# Patient Record
Sex: Female | Born: 1990 | Race: White | Hispanic: No | Marital: Married | Smoking: Never smoker
Health system: Southern US, Community
[De-identification: ages and names within clinical notes are randomized; demographics above are authoritative.]

## PROBLEM LIST (undated history)

## (undated) DIAGNOSIS — F32A Depression, unspecified: Secondary | ICD-10-CM

## (undated) DIAGNOSIS — K5010 Crohn's disease of large intestine without complications: Secondary | ICD-10-CM

## (undated) DIAGNOSIS — J45909 Unspecified asthma, uncomplicated: Secondary | ICD-10-CM

## (undated) DIAGNOSIS — F419 Anxiety disorder, unspecified: Secondary | ICD-10-CM

## (undated) DIAGNOSIS — F319 Bipolar disorder, unspecified: Secondary | ICD-10-CM

## (undated) HISTORY — PX: WISDOM TOOTH EXTRACTION: SHX21

## (undated) HISTORY — DX: Anxiety disorder, unspecified: F41.9

## (undated) HISTORY — DX: Depression, unspecified: F32.A

## (undated) HISTORY — PX: DILATION AND CURETTAGE OF UTERUS: SHX78

## (undated) HISTORY — DX: Unspecified asthma, uncomplicated: J45.909

## (undated) HISTORY — DX: Bipolar disorder, unspecified: F31.9

---

## 2016-12-11 ENCOUNTER — Emergency Department (HOSPITAL_COMMUNITY)
Admission: EM | Admit: 2016-12-11 | Discharge: 2016-12-11 | Disposition: A | Discharge status: Home / Self Care | Payer: Self-pay | Attending: Emergency Medicine | Admitting: Emergency Medicine

## 2016-12-11 DIAGNOSIS — Z79899 Other long term (current) drug therapy: Secondary | ICD-10-CM | POA: Insufficient documentation

## 2016-12-11 DIAGNOSIS — R112 Nausea with vomiting, unspecified: Secondary | ICD-10-CM | POA: Insufficient documentation

## 2016-12-11 DIAGNOSIS — R197 Diarrhea, unspecified: Secondary | ICD-10-CM | POA: Insufficient documentation

## 2016-12-11 LAB — COMPREHENSIVE METABOLIC PANEL
ALT: 12 U/L — AB (ref 14–54)
AST: 16 U/L (ref 15–41)
Albumin: 3.6 g/dL (ref 3.5–5.0)
Alkaline Phosphatase: 57 U/L (ref 38–126)
Anion gap: 8 (ref 5–15)
BILIRUBIN TOTAL: 0.4 mg/dL (ref 0.3–1.2)
BUN: 13 mg/dL (ref 6–20)
CO2: 24 mmol/L (ref 22–32)
CREATININE: 0.82 mg/dL (ref 0.44–1.00)
Calcium: 8.8 mg/dL — ABNORMAL LOW (ref 8.9–10.3)
Chloride: 108 mmol/L (ref 101–111)
GFR calc Af Amer: >60 mL/min (ref >60)
Glucose, Bld: 129 mg/dL — ABNORMAL HIGH (ref 65–99)
POTASSIUM: 3.4 mmol/L — AB (ref 3.5–5.1)
Sodium: 140 mmol/L (ref 135–145)
TOTAL PROTEIN: 7.7 g/dL (ref 6.5–8.1)

## 2016-12-11 LAB — CBC
HCT: 43.4 % (ref 36.0–46.0)
Hemoglobin: 15.2 g/dL — ABNORMAL HIGH (ref 12.0–15.0)
MCH: 30.3 pg (ref 26.0–34.0)
MCHC: 35 g/dL (ref 30.0–36.0)
MCV: 86.5 fL (ref 78.0–100.0)
PLATELETS: 365 10*3/uL (ref 150–400)
RBC: 5.02 MIL/uL (ref 3.87–5.11)
RDW: 12.4 % (ref 11.5–15.5)
WBC: 15 10*3/uL — AB (ref 4.0–10.5)

## 2016-12-11 LAB — LIPASE, BLOOD: Lipase: 22 U/L (ref 11–51)

## 2016-12-11 LAB — I-STAT BETA HCG BLOOD, ED (MC, WL, AP ONLY): I-stat hCG, quantitative: <5 m[IU]/mL (ref <5)

## 2016-12-11 MED ORDER — ONDANSETRON HCL 4 MG PO TABS: 4.0000 mg | ORAL_TABLET | Freq: Three times a day (TID) | ORAL | 0 refills | Status: DC | PRN

## 2016-12-11 MED ORDER — DIPHENHYDRAMINE HCL 50 MG/ML IJ SOLN
25.0000 mg | Freq: Once | INTRAMUSCULAR | Status: AC
  Administered 2016-12-11: 25 mg via INTRAVENOUS
  Filled 2016-12-11: qty 1

## 2016-12-11 MED ORDER — DEXAMETHASONE SODIUM PHOSPHATE 10 MG/ML IJ SOLN
10.0000 mg | Freq: Once | INTRAMUSCULAR | Status: AC
  Administered 2016-12-11: 10 mg via INTRAVENOUS
  Filled 2016-12-11: qty 1

## 2016-12-11 MED ORDER — FAMOTIDINE IN NACL 20-0.9 MG/50ML-% IV SOLN
20.0000 mg | Freq: Once | INTRAVENOUS | Status: AC
  Administered 2016-12-11: 20 mg via INTRAVENOUS
  Filled 2016-12-11: qty 50

## 2016-12-11 MED ORDER — ONDANSETRON HCL 4 MG/2ML IJ SOLN: 4.0000 mg | Freq: Once | INTRAMUSCULAR | Status: DC

## 2016-12-11 MED ORDER — SODIUM CHLORIDE 0.9 % IV BOLUS (SEPSIS)
1000.0000 mL | Freq: Once | INTRAVENOUS | Status: AC
  Administered 2016-12-11: 1000 mL via INTRAVENOUS

## 2016-12-11 MED ORDER — FAMOTIDINE 20 MG PO TABS: 20.0000 mg | ORAL_TABLET | Freq: Two times a day (BID) | ORAL | 0 refills | Status: DC

## 2016-12-11 MED ORDER — PREDNISONE 50 MG PO TABS: ORAL_TABLET | ORAL | 0 refills | Status: DC

## 2016-12-11 NOTE — ED Notes (Signed)
Pt escorted to restroom and given water.

## 2016-12-11 NOTE — ED Notes (Signed)
Patient states she has been drinking a cup of water with no emesis-given additional gingerale

## 2016-12-11 NOTE — Discharge Instructions (Signed)
1 to 2 tablets of 25 mg Benadryl pills every 4-6 hours as needed to a maximum of 300 mg per day. In addition, you may apply a topical hydrocortisone ointment to all affected areas except for the face.  ° °Do not hesitate to call 911 or return to the emergency room if you develop any shortness of breath, wheezing, tongue or lip swelling. ° °Please follow with your primary care doctor in the next 2 days for a check-up. They must obtain records for further management.  ° °Do not hesitate to return to the Emergency Department for any new, worsening or concerning symptoms.  ° ° °

## 2016-12-11 NOTE — ED Triage Notes (Signed)
Pt states she had an acute onset of N/V/D today with diaphoresis. 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 500cc NS given en route. 20g RAC. Hypotensive on arrival at 88/50 but normotensive now. Alert and oriented.

## 2016-12-11 NOTE — ED Provider Notes (Signed)
[facility]-EMERGENCY DEPT Provider Note   CSN: [id number] Arrival date & time: 12/11/16  1704     History   Chief Complaint Chief Complaint  Patient presents with  . Emesis    HPI   Blood pressure 115/90, pulse 90, temperature (!) 97.5 F (36.4 C), temperature source Oral, resp. rate 16, SpO2 100 %.  Katrina Maddox is a 26 y.o. female complaining of 2 to onset of severe nonbloody, nonbilious, coffee-ground emesis with associated diarrhea when she was in class earlier in the afternoon with associated upper epigastric discomfort. She states that the symptoms are similar to prior episode of red meat allergy. She did not have any red meat data she knows of, she had a drink at [facility] and then she had [employer]. She was mildly hypotensive on scene with a systolic of 88, EMS gave Zofran and 500 mL bolus. She denies any shortness of breath, lip or tongue swelling, hives or pruritus. She's never had an anaphylactic reaction and does not carry an EpiPen. She did not take any medication before EMS arrived. She denies any fever or sick contacts. She denies any dysuria, hematuria, urinary frequency, abnormal vaginal discharge, flank pain.  No past medical history on file.  There are no active problems to display for this patient.   No past surgical history on file.  OB History    No data available       Home Medications    Prior to Admission medications   Medication Sig Start Date End Date Taking? Authorizing Provider  fexofenadine (ALLEGRA) 30 MG tablet Take 30 mg by mouth daily.   Yes [provider]  ibuprofen (ADVIL,MOTRIN) 800 MG tablet Take 800 mg by mouth every 8 (eight) hours as needed for mild pain or moderate pain.   Yes [provider]  TRINESSA, 28, 0.18/0.215/0.25 MG-35 MCG tablet Take 1 tablet by mouth daily. 11/20/16  Yes [provider]  famotidine (PEPCID) 20 MG tablet Take 1 tablet (20 mg total) by mouth 2 (two) times daily. 12/11/16   Katrina Maddox,  Katrina Reining, PA-C  ondansetron (ZOFRAN) 4 MG tablet Take 1 tablet (4 mg total) by mouth every 8 (eight) hours as needed for nausea or vomiting. 12/11/16   Jerita Wimbush, Katrina Reining, PA-C  predniSONE (DELTASONE) 50 MG tablet Take 1 tablet daily with breakfast 12/11/16   Katrina Maddox, Katrina Reining, PA-C    Family History No family history on file.  Social History Social History  Substance Use Topics  . Smoking status: Not on file  . Smokeless tobacco: Not on file  . Alcohol use Not on file     Allergies   Gelatin and Other   Review of Systems Review of Systems  A complete review of systems was obtained and all systems are negative except as noted in the HPI and PMH.    Physical Exam Updated Vital Signs BP (!) 101/57   Pulse 85   Temp (!) 97.5 F (36.4 C) (Oral)   Resp 18   SpO2 100%   Physical Exam  Constitutional: She is oriented to person, place, and time. She appears well-developed and well-nourished. No distress.  HENT:  Head: Normocephalic and atraumatic.  Mouth/Throat: Oropharynx is clear and moist.  Eyes: Pupils are equal, round, and reactive to light. Conjunctivae and EOM are normal.  Neck: Normal range of motion.  Cardiovascular: Normal rate, regular rhythm and intact distal pulses.   Pulmonary/Chest: Effort normal and breath sounds normal.  Abdominal: Soft. There is no tenderness.  Mild tenderness to palpation  the epigastrium with no guarding or rebound  Musculoskeletal: Normal range of motion.  Neurological: She is alert and oriented to person, place, and time.  Skin: She is not diaphoretic.  Psychiatric: She has a normal mood and affect.  Nursing note and vitals reviewed.    ED Treatments / Results  Labs (all labs ordered are listed, but only abnormal results are displayed) Labs Reviewed  COMPREHENSIVE METABOLIC PANEL - Abnormal; Notable for the following:       Result Value   Potassium 3.4 (*)    Glucose, Bld 129 (*)    Calcium 8.8 (*)    ALT 12 (*)    All other  components within normal limits  CBC - Abnormal; Notable for the following:    WBC 15.0 (*)    Hemoglobin 15.2 (*)    All other components within normal limits  LIPASE, BLOOD  I-STAT BETA HCG BLOOD, ED ([facility], [facility], [facility] ONLY)    EKG  EKG Interpretation None       Radiology No results found.  Procedures Procedures (including critical care time)  Medications Ordered in ED Medications  sodium chloride 0.9 % bolus 1,000 mL (1,000 mLs Intravenous New Bag/Given 12/11/16 1734)  diphenhydrAMINE (BENADRYL) injection 25 mg (25 mg Intravenous Given 12/11/16 1734)  famotidine (PEPCID) IVPB 20 mg premix (0 mg Intravenous Stopped 12/11/16 1828)  dexamethasone (DECADRON) injection 10 mg (10 mg Intravenous Given 12/11/16 1735)     Initial Impression / Assessment and Plan / ED Course  I have reviewed the triage vital signs and the nursing notes.  Pertinent labs & imaging results that were available during my care of the patient were reviewed by me and considered in my medical decision making (see chart for details).     Vitals:   12/11/16 1720 12/11/16 1830  BP: 115/90 (!) 101/57  Pulse: 90 85  Resp: 16 18  Temp: (!) 97.5 F (36.4 C)   TempSrc: Oral   SpO2: 100% 100%    Medications  sodium chloride 0.9 % bolus 1,000 mL (1,000 mLs Intravenous New Bag/Given 12/11/16 1734)  diphenhydrAMINE (BENADRYL) injection 25 mg (25 mg Intravenous Given 12/11/16 1734)  famotidine (PEPCID) IVPB 20 mg premix (0 mg Intravenous Stopped 12/11/16 1828)  dexamethasone (DECADRON) injection 10 mg (10 mg Intravenous Given 12/11/16 1735)    Katrina Maddox is 26 y.o. female presenting with Acute onset of nausea, vomiting, diarrhea with soft blood pressure via EMS. Abdominal exam with tenderness in the epigastrium, no guarding or rebound. She states it is similar to prior red meat allergy although she did not ingest any red meat that she's aware today. Physical exam reassuring, vital signs within normal limits. Patient will be  observed in the ED, given fluid bolus, basic blood work checked and allergy cocktail given.  Blood work reassuring, no urinary symptoms. UA DC'd. Repeat abdominal exam nonsurgical. We will by mouth challenge.  Patient tolerating by mouth's. Repeat abdominal exam is benign.  On discharge, I was informed by the nurse it this patient felt very lightheaded when standing. However when she ambulates she states that she feels steady on her feet. She likely is a little bit dehydrated. Of advised her to go slow when standing up and push fluids.  Evaluation does not show pathology that would require ongoing emergent intervention or inpatient treatment. Pt is hemodynamically stable and mentating appropriately. Discussed findings and plan with patient/guardian, who agrees with care plan. All questions answered. Return precautions discussed and outpatient follow up given.  Final Clinical Impressions(s) / ED Diagnoses   Final diagnoses:  Nausea vomiting and diarrhea    New Prescriptions New Prescriptions   FAMOTIDINE (PEPCID) 20 MG TABLET    Take 1 tablet (20 mg total) by mouth 2 (two) times daily.   ONDANSETRON (ZOFRAN) 4 MG TABLET    Take 1 tablet (4 mg total) by mouth every 8 (eight) hours as needed for nausea or vomiting.   PREDNISONE (DELTASONE) 50 MG TABLET    Take 1 tablet daily with breakfast     Deyja Sochacki, Mardella Layman 12/11/16 2007    Lydiana Milley, Mardella Layman 12/11/16 2030    Linwood Dibbles, MD 12/12/16 [phone]

## 2016-12-11 NOTE — ED Notes (Signed)
Patient ambulated well with stand by assist. Denies dizzy ness and nausea. Provider aware.

## 2016-12-11 NOTE — ED Notes (Signed)
Bed: ZO10 Expected date:  Expected time:  Means of arrival:  Comments: 26 yo abd pain, hypotension

## 2019-04-08 ENCOUNTER — Emergency Department (HOSPITAL_COMMUNITY)
Admission: EM | Admit: 2019-04-08 | Discharge: 2019-04-08 | Disposition: A | Discharge status: Home / Self Care | Payer: BC Managed Care – PPO | Attending: Emergency Medicine | Admitting: Emergency Medicine

## 2019-04-08 ENCOUNTER — Other Ambulatory Visit: Payer: Self-pay

## 2019-04-08 ENCOUNTER — Encounter (HOSPITAL_COMMUNITY): Payer: Self-pay | Admitting: *Deleted

## 2019-04-08 DIAGNOSIS — R197 Diarrhea, unspecified: Secondary | ICD-10-CM | POA: Diagnosis not present

## 2019-04-08 DIAGNOSIS — R112 Nausea with vomiting, unspecified: Secondary | ICD-10-CM | POA: Diagnosis not present

## 2019-04-08 DIAGNOSIS — R1084 Generalized abdominal pain: Secondary | ICD-10-CM | POA: Insufficient documentation

## 2019-04-08 DIAGNOSIS — Z79899 Other long term (current) drug therapy: Secondary | ICD-10-CM | POA: Insufficient documentation

## 2019-04-08 LAB — URINALYSIS, ROUTINE W REFLEX MICROSCOPIC
Bacteria, UA: NONE SEEN
Bilirubin Urine: NEGATIVE
Glucose, UA: NEGATIVE mg/dL
Ketones, ur: NEGATIVE mg/dL
Leukocytes,Ua: NEGATIVE
Nitrite: NEGATIVE
Protein, ur: 100 mg/dL — AB
Specific Gravity, Urine: 1.029 (ref 1.005–1.030)
pH: 5 (ref 5.0–8.0)

## 2019-04-08 LAB — COMPREHENSIVE METABOLIC PANEL
ALT: 22 U/L (ref 0–44)
AST: 18 U/L (ref 15–41)
Albumin: 4.3 g/dL (ref 3.5–5.0)
Alkaline Phosphatase: 53 U/L (ref 38–126)
Anion gap: 8 (ref 5–15)
BUN: 17 mg/dL (ref 6–20)
CO2: 24 mmol/L (ref 22–32)
Calcium: 8.5 mg/dL — ABNORMAL LOW (ref 8.9–10.3)
Chloride: 105 mmol/L (ref 98–111)
Creatinine, Ser: 0.66 mg/dL (ref 0.44–1.00)
GFR calc Af Amer: >60 mL/min (ref >60)
GFR calc non Af Amer: >60 mL/min (ref >60)
Glucose, Bld: 98 mg/dL (ref 70–99)
Potassium: 3.3 mmol/L — ABNORMAL LOW (ref 3.5–5.1)
Sodium: 137 mmol/L (ref 135–145)
Total Bilirubin: 0.5 mg/dL (ref 0.3–1.2)
Total Protein: 7.9 g/dL (ref 6.5–8.1)

## 2019-04-08 LAB — CBC WITH DIFFERENTIAL/PLATELET
Abs Immature Granulocytes: 0.06 10*3/uL (ref 0.00–0.07)
Basophils Absolute: 0 10*3/uL (ref 0.0–0.1)
Basophils Relative: 0 %
Eosinophils Absolute: 0.1 10*3/uL (ref 0.0–0.5)
Eosinophils Relative: 1 %
HCT: 41.5 % (ref 36.0–46.0)
Hemoglobin: 14.4 g/dL (ref 12.0–15.0)
Immature Granulocytes: 0 %
Lymphocytes Relative: 8 %
Lymphs Abs: 1.2 10*3/uL (ref 0.7–4.0)
MCH: 32.1 pg (ref 26.0–34.0)
MCHC: 34.7 g/dL (ref 30.0–36.0)
MCV: 92.4 fL (ref 80.0–100.0)
Monocytes Absolute: 0.9 10*3/uL (ref 0.1–1.0)
Monocytes Relative: 6 %
Neutro Abs: 13.4 10*3/uL — ABNORMAL HIGH (ref 1.7–7.7)
Neutrophils Relative %: 85 %
Platelets: 260 10*3/uL (ref 150–400)
RBC: 4.49 MIL/uL (ref 3.87–5.11)
RDW: 11.7 % (ref 11.5–15.5)
WBC: 15.6 10*3/uL — ABNORMAL HIGH (ref 4.0–10.5)
nRBC: 0 % (ref 0.0–0.2)

## 2019-04-08 LAB — PREGNANCY, URINE: Preg Test, Ur: NEGATIVE

## 2019-04-08 LAB — LIPASE, BLOOD: Lipase: 24 U/L (ref 11–51)

## 2019-04-08 LAB — WET PREP, GENITAL
Clue Cells Wet Prep HPF POC: NONE SEEN
Trich, Wet Prep: NONE SEEN
Yeast Wet Prep HPF POC: NONE SEEN

## 2019-04-08 MED ORDER — METOCLOPRAMIDE HCL 5 MG/ML IJ SOLN
10.0000 mg | Freq: Once | INTRAMUSCULAR | Status: AC
  Administered 2019-04-08: 10 mg via INTRAVENOUS
  Filled 2019-04-08: qty 2

## 2019-04-08 MED ORDER — DICYCLOMINE HCL 10 MG PO CAPS
20.0000 mg | ORAL_CAPSULE | Freq: Once | ORAL | Status: AC
  Administered 2019-04-08: 20 mg via ORAL
  Filled 2019-04-08: qty 2

## 2019-04-08 MED ORDER — POTASSIUM CHLORIDE CRYS ER 20 MEQ PO TBCR: 20.0000 meq | EXTENDED_RELEASE_TABLET | Freq: Every day | ORAL | 0 refills | Status: DC

## 2019-04-08 MED ORDER — SODIUM CHLORIDE 0.9 % IV BOLUS
1000.0000 mL | Freq: Once | INTRAVENOUS | Status: AC
  Administered 2019-04-08: 1000 mL via INTRAVENOUS

## 2019-04-08 MED ORDER — METOCLOPRAMIDE HCL 10 MG PO TABS: 10.0000 mg | ORAL_TABLET | Freq: Three times a day (TID) | ORAL | 0 refills | Status: DC | PRN

## 2019-04-08 MED ORDER — DICYCLOMINE HCL 20 MG PO TABS: 20.0000 mg | ORAL_TABLET | Freq: Three times a day (TID) | ORAL | 0 refills | Status: DC | PRN

## 2019-04-08 MED ORDER — POTASSIUM CHLORIDE CRYS ER 20 MEQ PO TBCR
40.0000 meq | EXTENDED_RELEASE_TABLET | Freq: Once | ORAL | Status: AC
  Administered 2019-04-08: 20:00:00 40 meq via ORAL
  Filled 2019-04-08: qty 2

## 2019-04-08 NOTE — ED Provider Notes (Signed)
[facility] EMERGENCY DEPARTMENT Provider Note   CSN: [id number] Arrival date & time: 04/08/19  1635     History Chief Complaint  Patient presents with  . Abdominal Pain    Katrina Maddox is a 29 y.o. female without significant past medical hx who presents to the ED with complaints of abdominal pain that began @ 0300 today. Patient states that she developed pain to the periumbilical area which then seemed to spread to the rest of her generalized abdomen with subsequent 10-15 episodes of emesis and 2 episodes of diarrhea. Tried zofran without relief. Called 911- given zofran & toradol IV with improvement in her sxs. No other alleviating/aggravating factors. She is currently experiencing pain to the epigastric area which she feels is sore from vomiting as well as spasms/sharp pain to the lower abdomen. Denies fever, chills, hematemesis, melena, hematochezia, dysuria, or vaginal discharge. Mild vaginal bleeding started yesterday consistent w/ menses. She is sexually active in a monogamous relationship without concern for STDs. Denies recent travel. Denies marijuana use.   She has had 4 prior similar episodes over the past 2 months, typically sxs resolve within 10 hours of onset,  most recent episode was 04/01/19 which prompted ED visit to [facility]- she had RUQ [place] that was negative, CT A/P: infectious enteritis vs. Inflammatory bowel disease, GI consulted, discharged home on Augmentin w/ outpatient follow up. Followed up with GI 04/05/19, plan for outpatient labs & for colonoscopy 04/22/18.    HPI     History reviewed. No pertinent past medical history.  There are no problems to display for this patient.   Past Surgical History:  Procedure Laterality Date  . WISDOM TOOTH EXTRACTION       OB History   No obstetric history on file.     No family history on file.  Social History   Tobacco Use  . Smoking status: Never Smoker  . Smokeless tobacco: Never Used  Substance Use Topics  .  Alcohol use: Yes  . Drug use: Never    Home Medications Prior to Admission medications   Medication Sig Start Date End Date Taking? Authorizing Provider  famotidine (PEPCID) 20 MG tablet Take 1 tablet (20 mg total) by mouth 2 (two) times daily. 12/11/16   Pisciotta, Joni Reining, PA-C  fexofenadine (ALLEGRA) 30 MG tablet Take 30 mg by mouth daily.    [provider]  ibuprofen (ADVIL,MOTRIN) 800 MG tablet Take 800 mg by mouth every 8 (eight) hours as needed for mild pain or moderate pain.    [provider]  ondansetron (ZOFRAN) 4 MG tablet Take 1 tablet (4 mg total) by mouth every 8 (eight) hours as needed for nausea or vomiting. 12/11/16   Pisciotta, Joni Reining, PA-C  predniSONE (DELTASONE) 50 MG tablet Take 1 tablet daily with breakfast 12/11/16   Pisciotta, Nicole, PA-C  TRINESSA, 28, 0.18/0.215/0.25 MG-35 MCG tablet Take 1 tablet by mouth daily. 11/20/16   [provider]    Allergies    Gelatin and Other  Review of Systems   Review of Systems  Constitutional: Negative for chills and fever.  Respiratory: Negative for shortness of breath.   Cardiovascular: Negative for chest pain.  Gastrointestinal: Positive for abdominal pain, diarrhea, nausea and vomiting. Negative for anal bleeding, blood in stool and constipation.  Genitourinary: Positive for vaginal bleeding. Negative for dysuria and vaginal discharge.  Neurological: Negative for syncope.  All other systems reviewed and are negative.   Physical Exam Updated Vital Signs Ht 5\' 5"  (1.651 m)  Wt 100.7 kg   LMP 04/06/2019   BMI 36.94 kg/m   Physical Exam Vitals and nursing note reviewed. Exam conducted with a chaperone present.  Constitutional:      General: She is not in acute distress.    Appearance: She is well-developed. She is not toxic-appearing.  HENT:     Head: Normocephalic and atraumatic.  Eyes:     General:        Right eye: No discharge.        Left eye: No discharge.     Conjunctiva/sclera:  Conjunctivae normal.  Cardiovascular:     Rate and Rhythm: Normal rate and regular rhythm.  Pulmonary:     Effort: Pulmonary effort is normal. No respiratory distress.     Breath sounds: Normal breath sounds. No wheezing, rhonchi or rales.  Abdominal:     General: There is no distension.     Palpations: Abdomen is soft.     Tenderness: There is generalized abdominal tenderness (epigastric, periumbilical, and lower abdomen most prominent). There is no guarding or rebound.  Genitourinary:    Labia:        Right: No lesion.        Left: No lesion.      Comments: No discharge. Mild blood present. IUD strings present coming from cervix. Mild discomfort throughout without focal tenderness or cervical motion tenderness.  Musculoskeletal:     Cervical back: Neck supple.  Skin:    General: Skin is warm and dry.     Findings: No rash.  Neurological:     Mental Status: She is alert.     Comments: Clear speech.   Psychiatric:        Behavior: Behavior normal.     ED Results / Procedures / Treatments   Labs (all labs ordered are listed, but only abnormal results are displayed) Labs Reviewed - No data to display  EKG None  Radiology No results found.  Procedures Procedures (including critical care time)  Medications Ordered in ED Medications - No data to display  ED Course  I have reviewed the triage vital signs and the nursing notes.  Pertinent labs & imaging results that were available during my care of the patient were reviewed by me and considered in my medical decision making (see chart for details).     Prior results reviewed:  CT A/P:  IMPRESSION:  Air-fluid levels at nondistended bowel, which may represent enteritis. Additionally, enhancement/hyperemia at mucosal of terminal ileum. Differential diagnosis includes infectious enteritis and inflammatory bowel disease.  No evidence of appendicitis or diverticulitis.  Physiologic amount of free fluid in the pelvis.   RUQ [place]:  Negative  Bonne Driver was evaluated in Emergency Department on 04/08/2019 for the symptoms described in the history of present illness. He/she was evaluated in the context of the global COVID-19 pandemic, which necessitated consideration that the patient might be at risk for infection with the SARS-CoV-2 virus that causes COVID-19. Institutional protocols and algorithms that pertain to the evaluation of patients at risk for COVID-19 are in a state of rapid change based on information released by regulatory bodies including the CDC and federal and state organizations. These policies and algorithms were followed during the patient's care in the ED.  MDM Rules/Calculators/A&P                      Patient presents to the ED with complaints of abdominal pain with N/V/D 5th episode of similar- most recent was  04/01/19 which prompted ED visit to [facility]- she had RUQ [place] that was negative, CT A/P: infectious enteritis vs. Inflammatory bowel disease, GI consulted, discharged home on Augmentin w/ outpatient follow up. Followed up with GI 04/05/19, plan for outpatient labs & for colonoscopy 04/22/18. She is on her last day of Augmentin today. Abdomen with generalized tenderness, more so in the epigastric, periumbilical, and lower abdominal regions. No peritoneal signs. Pelvic performed, no significant vaginal discharge, bleeding consistent w/ her menses, IUD strings appear in appropriate place, H&P do not seem consistent with PID. Plan for labs with trial of fluids, bentyl & reglan.   CBC: Leukocytosis @ 15.6, similar to prior, downtrending from 20 @ last ED visit. No anemia.  CMP: Hypokalemia @ 3.3- oral replacement, diet guidelines. Hypocalcemia @ 8.5- diet guidelines. Renal function & LFTs WNL.  Lipase: WNL UA: Consistent with menses Preg test negative Wet prep: No BV, Trich, or yeast.   Patient feeling improved on re-assessment, tolerating PO, repeat abdominal exam remains w/o peritoneal signs,  recent imaging reviewed and with similar presentation do not feel this need repeating, doubt acute surgical etiology currently such as appendicitis, cholecystitis, perf, or obstruction. Negative pregnancy- doubt ectopic.  Symptomatic care at home. She is established with GI and we have discussed close follow up.  I discussed results, treatment plan, need for follow-up, and return precautions with the patient. Provided opportunity for questions, patient confirmed understanding and is in agreement with plan.   Findings and plan of care discussed with supervising physician Dr. Sedonia Small who is in agreement.   Final Clinical Impression(s) / ED Diagnoses Final diagnoses:  Generalized abdominal pain    Rx / DC Orders ED Discharge Orders         Ordered    dicyclomine (BENTYL) 20 MG tablet  Every 8 hours PRN     04/08/19 1930    metoCLOPramide (REGLAN) 10 MG tablet  Every 8 hours PRN     04/08/19 1930    potassium chloride SA (KLOR-CON) 20 MEQ tablet  Daily     04/08/19 1930           Amaryllis Dyke, PA-C 04/08/19 1946    Maudie Flakes, MD 04/14/19 2056

## 2019-04-08 NOTE — ED Triage Notes (Signed)
Pt gc/o abd pain with n/v that started a month ago but has been intermittent, was seen at [facility] in  for same. Pt was given zofran 4 mg iv and toradol 30 mg iv by ems

## 2019-04-08 NOTE — Discharge Instructions (Addendum)
You were seen in the emergency department today for nausea, vomiting, abdominal pain, diarrhea.  Your labs overall looked reassuring.  Your potassium and calcium are a bit low, please follow attached diet guidelines, we are also sending you home with a few days of a potassium supplement.  We are sending you with Bentyl to take every 8 hours as needed for abdominal cramping/spasms as well as Reglan to take every 8 hours as needed for nausea and vomiting.  We have prescribed you new medication(s) today. Discuss the medications prescribed today with your pharmacist as they can have adverse effects and interactions with your other medicines including over the counter and prescribed medications. Seek medical evaluation if you start to experience new or abnormal symptoms after taking one of these medicines, seek care immediately if you start to experience difficulty breathing, feeling of your throat closing, facial swelling, or rash as these could be indications of a more serious allergic reaction  Please call your GI doctor on Monday to make them aware of your ER visit and to establish close follow-up.  Return to the ER for new or worsening symptoms including but not limited to worsening pain, inability to keep fluids down, fever, blood in your vomit or stool, or any other concerns.

## 2019-04-11 LAB — GC/CHLAMYDIA PROBE AMP (~~LOC~~) NOT AT ARMC
Chlamydia: NEGATIVE
Neisseria Gonorrhea: NEGATIVE

## 2020-02-18 ENCOUNTER — Encounter (HOSPITAL_COMMUNITY): Payer: Self-pay | Admitting: Emergency Medicine

## 2020-02-18 ENCOUNTER — Emergency Department (HOSPITAL_COMMUNITY)
Admission: EM | Admit: 2020-02-18 | Discharge: 2020-02-18 | Disposition: A | Discharge status: Home / Self Care | Payer: BC Managed Care – PPO | Attending: Emergency Medicine | Admitting: Emergency Medicine

## 2020-02-18 ENCOUNTER — Other Ambulatory Visit: Payer: Self-pay

## 2020-02-18 DIAGNOSIS — R197 Diarrhea, unspecified: Secondary | ICD-10-CM | POA: Diagnosis not present

## 2020-02-18 DIAGNOSIS — R1084 Generalized abdominal pain: Secondary | ICD-10-CM | POA: Insufficient documentation

## 2020-02-18 DIAGNOSIS — R112 Nausea with vomiting, unspecified: Secondary | ICD-10-CM | POA: Diagnosis not present

## 2020-02-18 DIAGNOSIS — R109 Unspecified abdominal pain: Secondary | ICD-10-CM | POA: Diagnosis present

## 2020-02-18 HISTORY — DX: Crohn's disease of large intestine without complications: K50.10

## 2020-02-18 LAB — CBC WITH DIFFERENTIAL/PLATELET
Abs Immature Granulocytes: 0.06 10*3/uL (ref 0.00–0.07)
Basophils Absolute: 0 10*3/uL (ref 0.0–0.1)
Basophils Relative: 0 %
Eosinophils Absolute: 0 10*3/uL (ref 0.0–0.5)
Eosinophils Relative: 0 %
HCT: 41.4 % (ref 36.0–46.0)
Hemoglobin: 14 g/dL (ref 12.0–15.0)
Immature Granulocytes: 1 %
Lymphocytes Relative: 17 %
Lymphs Abs: 1.6 10*3/uL (ref 0.7–4.0)
MCH: 30.5 pg (ref 26.0–34.0)
MCHC: 33.8 g/dL (ref 30.0–36.0)
MCV: 90.2 fL (ref 80.0–100.0)
Monocytes Absolute: 0.3 10*3/uL (ref 0.1–1.0)
Monocytes Relative: 3 %
Neutro Abs: 7.5 10*3/uL (ref 1.7–7.7)
Neutrophils Relative %: 79 %
Platelets: 301 10*3/uL (ref 150–400)
RBC: 4.59 MIL/uL (ref 3.87–5.11)
RDW: 11.8 % (ref 11.5–15.5)
WBC: 9.5 10*3/uL (ref 4.0–10.5)
nRBC: 0 % (ref 0.0–0.2)

## 2020-02-18 LAB — COMPREHENSIVE METABOLIC PANEL
ALT: 17 U/L (ref 0–44)
AST: 19 U/L (ref 15–41)
Albumin: 3.7 g/dL (ref 3.5–5.0)
Alkaline Phosphatase: 52 U/L (ref 38–126)
Anion gap: 12 (ref 5–15)
BUN: 10 mg/dL (ref 6–20)
CO2: 22 mmol/L (ref 22–32)
Calcium: 8.5 mg/dL — ABNORMAL LOW (ref 8.9–10.3)
Chloride: 104 mmol/L (ref 98–111)
Creatinine, Ser: 0.61 mg/dL (ref 0.44–1.00)
GFR, Estimated: >60 mL/min (ref >60)
Glucose, Bld: 95 mg/dL (ref 70–99)
Potassium: 3.6 mmol/L (ref 3.5–5.1)
Sodium: 138 mmol/L (ref 135–145)
Total Bilirubin: 0.4 mg/dL (ref 0.3–1.2)
Total Protein: 7.4 g/dL (ref 6.5–8.1)

## 2020-02-18 LAB — LIPASE, BLOOD: Lipase: 27 U/L (ref 11–51)

## 2020-02-18 LAB — URINALYSIS, ROUTINE W REFLEX MICROSCOPIC
Bilirubin Urine: NEGATIVE
Glucose, UA: NEGATIVE mg/dL
Hgb urine dipstick: NEGATIVE
Ketones, ur: NEGATIVE mg/dL
Leukocytes,Ua: NEGATIVE
Nitrite: NEGATIVE
Protein, ur: NEGATIVE mg/dL
Specific Gravity, Urine: 1.021 (ref 1.005–1.030)
pH: 6 (ref 5.0–8.0)

## 2020-02-18 LAB — I-STAT BETA HCG BLOOD, ED (MC, WL, AP ONLY): I-stat hCG, quantitative: <5 m[IU]/mL (ref <5)

## 2020-02-18 MED ORDER — DICYCLOMINE HCL 10 MG PO CAPS
10.0000 mg | ORAL_CAPSULE | Freq: Once | ORAL | Status: AC
  Administered 2020-02-18: 10 mg via ORAL
  Filled 2020-02-18: qty 1

## 2020-02-18 MED ORDER — DICYCLOMINE HCL 20 MG PO TABS: 20.0000 mg | ORAL_TABLET | Freq: Three times a day (TID) | ORAL | 0 refills | Status: DC | PRN

## 2020-02-18 MED ORDER — SODIUM CHLORIDE 0.9 % IV BOLUS
1000.0000 mL | Freq: Once | INTRAVENOUS | Status: AC
  Administered 2020-02-18: 1000 mL via INTRAVENOUS

## 2020-02-18 MED ORDER — ONDANSETRON 4 MG PO TBDP: 4.0000 mg | ORAL_TABLET | Freq: Three times a day (TID) | ORAL | 0 refills | Status: DC | PRN

## 2020-02-18 MED ORDER — KETOROLAC TROMETHAMINE 15 MG/ML IJ SOLN
15.0000 mg | Freq: Once | INTRAMUSCULAR | Status: AC
  Administered 2020-02-18: 15 mg via INTRAVENOUS
  Filled 2020-02-18: qty 1

## 2020-02-18 NOTE — ED Notes (Signed)
Pt tolerated PO intake of graham crackers and soda w/o NV

## 2020-02-18 NOTE — ED Notes (Signed)
Pt tolerated PO challenge of soda and graham crackers w/o N

## 2020-02-18 NOTE — Discharge Instructions (Addendum)
Continue taking home medication as prescribed. Use Tylenol or ibuprofen as needed for pain.  You may supplement with Bentyl as needed for further abdominal pain or cramping. Zofran as needed for nausea or vomiting. Eat a gentle, bland diet until your symptoms improve. Follow-up with your GI doctor for further evaluation of your symptoms. Return to the emergency room if you develop fevers, persistent vomiting, severe worsening abdominal pain, persistent bleeding from your bottom, or any new, worsening, or concerning symptoms.

## 2020-02-18 NOTE — ED Triage Notes (Signed)
Pt. Stated, Katrina Maddox had a episode of Crohn's disease. This started 3-4 days ago. Ive had diarrhea, throwing. I ususally come here because yall give me fluids and medicine instead of just medicine.

## 2020-02-18 NOTE — ED Provider Notes (Signed)
[facility] EMERGENCY DEPARTMENT Provider Note   CSN: [id number] Arrival date & time: 02/18/20  0830     History Chief Complaint  Patient presents with  . Abdominal Pain  . Emesis  . Diarrhea    Katrina Maddox is a 29 y.o. female presenting for evaluation of abdominal pain, nausea, vomiting, abnormal bowel movements.  Patient states she has a history of Crohn's.  For the past few days, she has felt like she has had a Crohn's flare.  She reports persistent nausea, dry heaves last night.  She reports lower abdominal pain with sharp epigastric pain, likely secondary to vomiting.  She was able to take her ODT Zofran this morning, which improved her nausea and vomiting.  She has not taken anything for pain.  She denies fevers, chills, chest pain, shortness breath, cough.  She does have decreased urination, which she attributes to decreased p.o. intake and diarrhea.  She has intermittent blood in her stools, this is typical for her.  She is on mesalamine for Crohn's, no recent change in dose.  She is not on any prednisone.  She follows with [facility].  No previous history of abdominal surgeries.  No sick contacts.  HPI     Past Medical History:  Diagnosis Date  . Crohn's colitis (HCC)     There are no problems to display for this patient.   Past Surgical History:  Procedure Laterality Date  . WISDOM TOOTH EXTRACTION       OB History   No obstetric history on file.     No family history on file.  Social History   Tobacco Use  . Smoking status: Never Smoker  . Smokeless tobacco: Never Used  Substance Use Topics  . Alcohol use: Yes  . Drug use: Never    Home Medications Prior to Admission medications   Medication Sig Start Date End Date Taking? Authorizing Provider  dicyclomine (BENTYL) 20 MG tablet Take 1 tablet (20 mg total) by mouth every 8 (eight) hours as needed for spasms. 02/18/20   Rayven Hendrickson, PA-C  famotidine (PEPCID) 20 MG tablet Take  1 tablet (20 mg total) by mouth 2 (two) times daily. 12/11/16   Pisciotta, Joni Reining, PA-C  fexofenadine (ALLEGRA) 30 MG tablet Take 30 mg by mouth daily.    [provider]  ibuprofen (ADVIL,MOTRIN) 800 MG tablet Take 800 mg by mouth every 8 (eight) hours as needed for mild pain or moderate pain.    [provider]  metoCLOPramide (REGLAN) 10 MG tablet Take 1 tablet (10 mg total) by mouth every 8 (eight) hours as needed for nausea. 04/08/19   Petrucelli, Samantha R, PA-C  ondansetron (ZOFRAN ODT) 4 MG disintegrating tablet Take 1 tablet (4 mg total) by mouth every 8 (eight) hours as needed for nausea or vomiting. 02/18/20   Asif Muchow, PA-C  ondansetron (ZOFRAN) 4 MG tablet Take 1 tablet (4 mg total) by mouth every 8 (eight) hours as needed for nausea or vomiting. 12/11/16   Pisciotta, Joni Reining, PA-C  potassium chloride SA (KLOR-CON) 20 MEQ tablet Take 1 tablet (20 mEq total) by mouth daily. 04/08/19   Petrucelli, Pleas Koch, PA-C  predniSONE (DELTASONE) 50 MG tablet Take 1 tablet daily with breakfast 12/11/16   Pisciotta, Nicole, PA-C  TRINESSA, 28, 0.18/0.215/0.25 MG-35 MCG tablet Take 1 tablet by mouth daily. 11/20/16   [provider]    Allergies    Gelatin and Other  Review of Systems   Review of Systems  Gastrointestinal: Positive for abdominal pain, diarrhea, nausea and vomiting.  Allergic/Immunologic: Positive for immunocompromised state (on mesalamine).  All other systems reviewed and are negative.   Physical Exam Updated Vital Signs BP 127/89   Pulse 83   Temp 98 F (36.7 C)   Resp 18   LMP 02/05/2020   SpO2 100%   Physical Exam Vitals and nursing note reviewed.  Constitutional:      General: She is not in acute distress.    Appearance: She is well-developed.     Comments: Appears nontoxic  HENT:     Head: Normocephalic and atraumatic.  Eyes:     Extraocular Movements: Extraocular movements intact.     Conjunctiva/sclera: Conjunctivae normal.      Pupils: Pupils are equal, round, and reactive to light.  Cardiovascular:     Rate and Rhythm: Normal rate and regular rhythm.     Pulses: Normal pulses.  Pulmonary:     Effort: Pulmonary effort is normal. No respiratory distress.     Breath sounds: Normal breath sounds. No wheezing.  Abdominal:     General: There is no distension.     Palpations: Abdomen is soft. There is no mass.     Tenderness: There is no abdominal tenderness. There is no guarding or rebound.     Comments: Mild diffuse tenderness palpation of the abdomen.  No rigidity, guarding, distention.  No rebound.  No peritonitis.  Musculoskeletal:        General: Normal range of motion.     Cervical back: Normal range of motion and neck supple.  Skin:    General: Skin is warm and dry.     Capillary Refill: Capillary refill takes less than 2 seconds.  Neurological:     Mental Status: She is alert and oriented to person, place, and time.     ED Results / Procedures / Treatments   Labs (all labs ordered are listed, but only abnormal results are displayed) Labs Reviewed  COMPREHENSIVE METABOLIC PANEL - Abnormal; Notable for the following components:      Result Value   Calcium 8.5 (*)    All other components within normal limits  LIPASE, BLOOD  URINALYSIS, ROUTINE W REFLEX MICROSCOPIC  CBC WITH DIFFERENTIAL/PLATELET  I-STAT BETA HCG BLOOD, ED ([facility], [facility], [facility] ONLY)    EKG None  Radiology No results found.  Procedures Procedures (including critical care time)  Medications Ordered in ED Medications  ketorolac (TORADOL) 15 MG/ML injection 15 mg (15 mg Intravenous Given 02/18/20 0958)  sodium chloride 0.9 % bolus 1,000 mL (0 mLs Intravenous Stopped 02/18/20 1056)  dicyclomine (BENTYL) capsule 10 mg (10 mg Oral Given 02/18/20 1056)    ED Course  I have reviewed the triage vital signs and the nursing notes.  Pertinent labs & imaging results that were available during my care of the patient were reviewed by me and  considered in my medical decision making (see chart for details).    MDM Rules/Calculators/A&P                          Patient presented for evaluation nausea, vomiting, abdominal pain.  On exam, patient appears nontoxic.  She does have diffuse tenderness palpation of the abdomen.  She states this feels similar to her previous Crohn's flares, likely this.  However also consider pancreatitis, gallbladder etiology, viral GI illness.  Will obtain labs, treat symptomatically, and reassess.  Labs interpreted by me, overall reassuring granulocytosis.  Electrolytes  stable.  Kidney, liver, pancreatic function normal.  On reassessment, patient reports improvement of pain, however she continues to have discomfort in her lower abdomen.  She is hungry, but scared to try and eat.  Will p.o. challenge here in the ER, give Bentyl for further pain control and reassess.  On reassessment, patient tolerated p.o. without difficulty.  She is feeling better.  I discussed continued symptomatic treatment at home, and close follow-up with GI.  At this time, patient appears safe for discharge.  Return precautions given.  Patient states she understands and agrees to plan.  Final Clinical Impression(s) / ED Diagnoses Final diagnoses:  Nausea vomiting and diarrhea  Generalized abdominal pain    Rx / DC Orders ED Discharge Orders         Ordered    dicyclomine (BENTYL) 20 MG tablet  Every 8 hours PRN        02/18/20 1142    ondansetron (ZOFRAN ODT) 4 MG disintegrating tablet  Every 8 hours PRN        02/18/20 1142           Siddharth Babington, PA-C 02/18/20 1143    Lorre Nick, MD 02/24/20 1352

## 2021-06-20 LAB — OB RESULTS CONSOLE HIV ANTIBODY (ROUTINE TESTING): HIV: NONREACTIVE

## 2021-06-20 LAB — HEPATITIS C ANTIBODY: HCV Ab: NEGATIVE

## 2021-06-20 LAB — OB RESULTS CONSOLE RUBELLA ANTIBODY, IGM: Rubella: IMMUNE

## 2021-06-20 LAB — OB RESULTS CONSOLE RPR: RPR: NONREACTIVE

## 2021-06-20 LAB — OB RESULTS CONSOLE ABO/RH: RH Type: POSITIVE

## 2021-06-20 LAB — OB RESULTS CONSOLE ANTIBODY SCREEN: Antibody Screen: NEGATIVE

## 2021-06-20 LAB — OB RESULTS CONSOLE HEPATITIS B SURFACE ANTIGEN: Hepatitis B Surface Ag: NEGATIVE

## 2021-07-05 LAB — OB RESULTS CONSOLE GC/CHLAMYDIA
Chlamydia: NEGATIVE
Neisseria Gonorrhea: NEGATIVE

## 2021-08-02 ENCOUNTER — Other Ambulatory Visit: Payer: Self-pay | Admitting: Obstetrics and Gynecology

## 2021-08-02 DIAGNOSIS — Z3689 Encounter for other specified antenatal screening: Secondary | ICD-10-CM

## 2021-08-08 ENCOUNTER — Other Ambulatory Visit: Payer: Self-pay

## 2021-08-26 ENCOUNTER — Encounter: Payer: Self-pay | Admitting: *Deleted

## 2021-08-30 ENCOUNTER — Ambulatory Visit (HOSPITAL_BASED_OUTPATIENT_CLINIC_OR_DEPARTMENT_OTHER): Payer: No Typology Code available for payment source | Admitting: Obstetrics

## 2021-08-30 ENCOUNTER — Other Ambulatory Visit: Payer: Self-pay | Admitting: *Deleted

## 2021-08-30 ENCOUNTER — Ambulatory Visit: Payer: No Typology Code available for payment source | Attending: Obstetrics and Gynecology

## 2021-08-30 ENCOUNTER — Ambulatory Visit: Payer: No Typology Code available for payment source | Admitting: *Deleted

## 2021-08-30 VITALS — BP 115/67 | HR 86

## 2021-08-30 DIAGNOSIS — O99212 Obesity complicating pregnancy, second trimester: Secondary | ICD-10-CM

## 2021-08-30 DIAGNOSIS — F319 Bipolar disorder, unspecified: Secondary | ICD-10-CM | POA: Diagnosis present

## 2021-08-30 DIAGNOSIS — O99342 Other mental disorders complicating pregnancy, second trimester: Secondary | ICD-10-CM | POA: Insufficient documentation

## 2021-08-30 DIAGNOSIS — K5090 Crohn's disease, unspecified, without complications: Secondary | ICD-10-CM

## 2021-08-30 DIAGNOSIS — Z3A19 19 weeks gestation of pregnancy: Secondary | ICD-10-CM

## 2021-08-30 DIAGNOSIS — O99612 Diseases of the digestive system complicating pregnancy, second trimester: Secondary | ICD-10-CM

## 2021-08-30 DIAGNOSIS — Z3689 Encounter for other specified antenatal screening: Secondary | ICD-10-CM | POA: Diagnosis present

## 2021-08-30 DIAGNOSIS — K50919 Crohn's disease, unspecified, with unspecified complications: Secondary | ICD-10-CM

## 2021-08-30 NOTE — Progress Notes (Signed)
MFM Note  Katrina Maddox was seen for a detailed fetal anatomy scan due to maternal obesity with a BMI of 44.1, history of Crohn's disease that is not treated with any medications, and bipolar disorder that is currently treated with Geodon.    The patient reports that she was diagnosed with Crohn's disease about 2 years ago.  She is being followed by [facility] gastroenterology.  She reports that her psychologist is considering switching her to Abilify soon.  She denies any problems in her current pregnancy.    She had a cell free DNA test earlier in her pregnancy which indicated a low risk for trisomy 76, 68, and 13. A female fetus is predicted.  She was informed that the fetal growth and amniotic fluid level were appropriate for her gestational age.   There were no obvious fetal anomalies noted on today's ultrasound exam.  However, today's exam was limited due to the fetal position.  The patient was informed that anomalies may be missed due to technical limitations. If the fetus is in a suboptimal position or maternal habitus is increased, visualization of the fetus in the maternal uterus may be impaired.  The following were discussed during today's consultation:  Crohn's disease in pregnancy  The patient was advised to continue close follow-up with her gastroenterologist throughout the pregnancy.    She was advised that should she require treatment for an acute flare, that oral steroids such as prednisone would be safe to take in pregnancy.  Immunologic medications such as Remicade may also be used for treatment during pregnancy.  Due to the increased risk of IUGR associated with Crohn's disease in pregnancy, we will continue to follow her with monthly growth ultrasounds.  Bipolar disorder in pregnancy  She was advised that there is limited information regarding the use of Geodon in pregnancy.  Although the data regarding Abilify use in pregnancy are also limited, she was advised that I  have had a few patients in the past who have taken Abilify during pregnancy good outcomes.  As she will be unlikely to function normally without taking a medication for treatment of her bipolar disorder, she was advised to take the most appropriate medication for treatment of bipolar disorder as recommended by her psychiatrist.  Obesity in pregnancy  The recommended total weight gain in pregnancy for obese women's between 10 to 20 pounds.  She should be screened for gestational diabetes at 73 to 28 weeks.  As maternal obesity may present challenges associated with the management of anesthesia, an anesthesia consult should be obtained when she is admitted in labor.  She will return in 4 weeks for a follow-up growth scan.  We will reassess the views of the fetal anatomy during that exam.    The patient stated that all of her questions were answered today.  A total of 30 minutes was spent counseling and coordinating the care for this patient.  Greater than 50% of the time was spent in direct face-to-face contact.

## 2021-09-26 ENCOUNTER — Ambulatory Visit: Payer: No Typology Code available for payment source | Attending: Obstetrics

## 2021-09-26 ENCOUNTER — Encounter: Payer: Self-pay | Admitting: *Deleted

## 2021-09-26 ENCOUNTER — Ambulatory Visit: Payer: No Typology Code available for payment source | Admitting: *Deleted

## 2021-09-26 VITALS — BP 129/68 | HR 78

## 2021-09-26 DIAGNOSIS — E669 Obesity, unspecified: Secondary | ICD-10-CM

## 2021-09-26 DIAGNOSIS — O99212 Obesity complicating pregnancy, second trimester: Secondary | ICD-10-CM

## 2021-09-26 DIAGNOSIS — Z6841 Body Mass Index (BMI) 40.0 and over, adult: Secondary | ICD-10-CM | POA: Insufficient documentation

## 2021-09-26 DIAGNOSIS — O99342 Other mental disorders complicating pregnancy, second trimester: Secondary | ICD-10-CM

## 2021-09-26 DIAGNOSIS — Z3A22 22 weeks gestation of pregnancy: Secondary | ICD-10-CM

## 2021-09-26 DIAGNOSIS — O99612 Diseases of the digestive system complicating pregnancy, second trimester: Secondary | ICD-10-CM | POA: Diagnosis not present

## 2021-09-26 DIAGNOSIS — F319 Bipolar disorder, unspecified: Secondary | ICD-10-CM | POA: Diagnosis present

## 2021-09-26 DIAGNOSIS — Z362 Encounter for other antenatal screening follow-up: Secondary | ICD-10-CM

## 2021-09-26 DIAGNOSIS — K50919 Crohn's disease, unspecified, with unspecified complications: Secondary | ICD-10-CM

## 2021-09-26 IMAGING — US US MFM OB FOLLOW-UP
1 series · 14 of 28 positions shown · non-contrast
Comparison: none

[Series 1: us mfm ob follow-up · 14 of 37 slices shown]
[im 2/37]
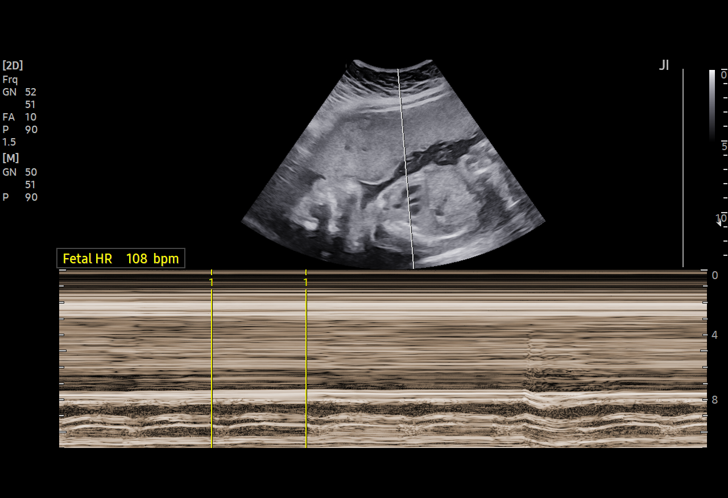
[im 5/37]
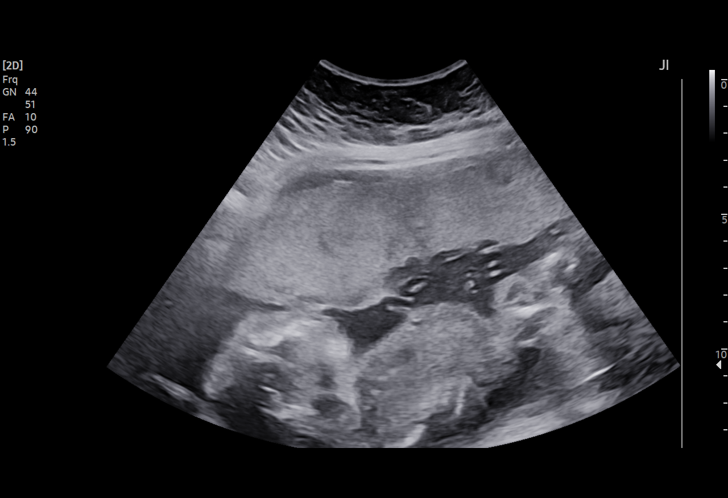
[im 7/37]
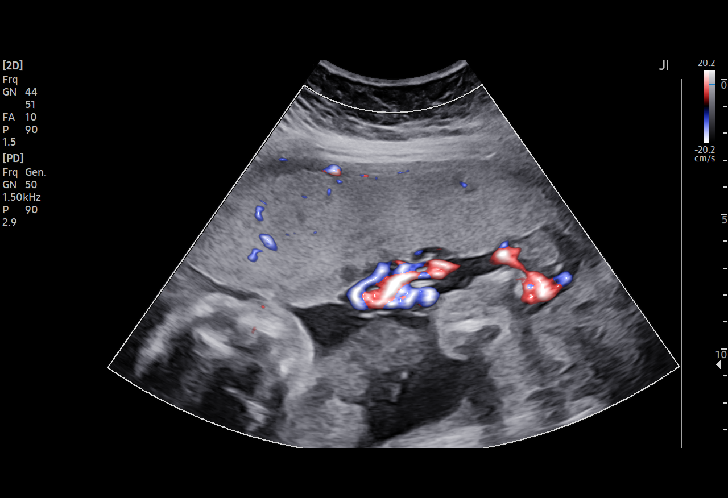
[im 10/37]
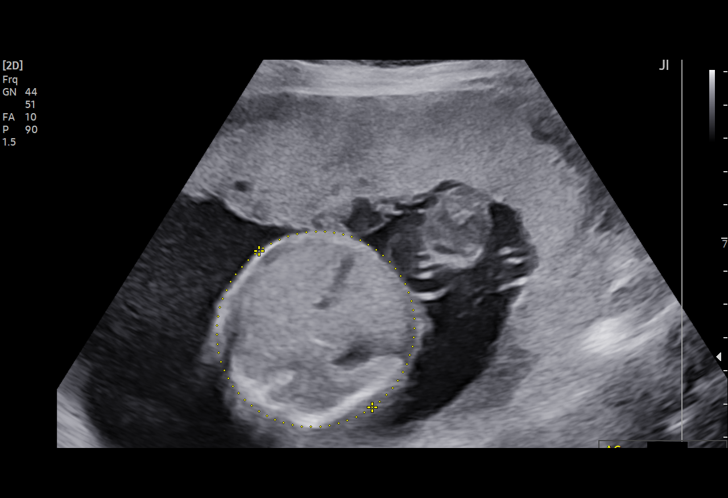
[im 13/37]
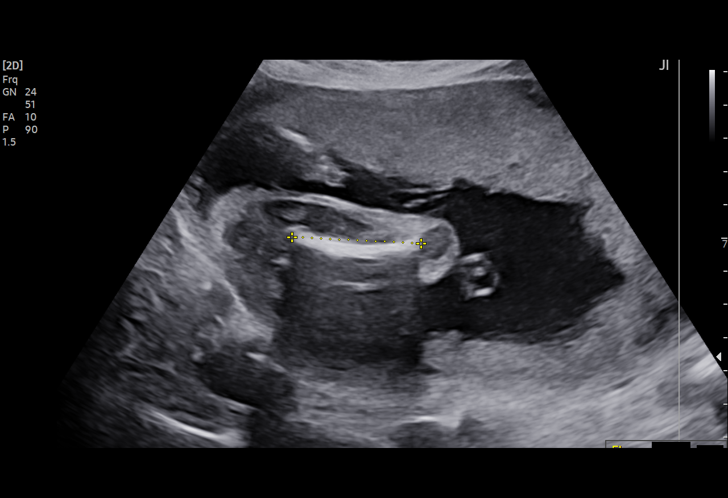
[im 15/37]
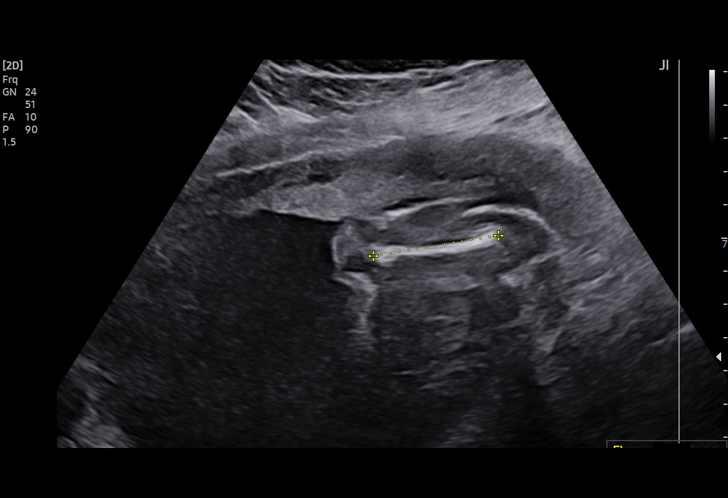
[im 18/37]
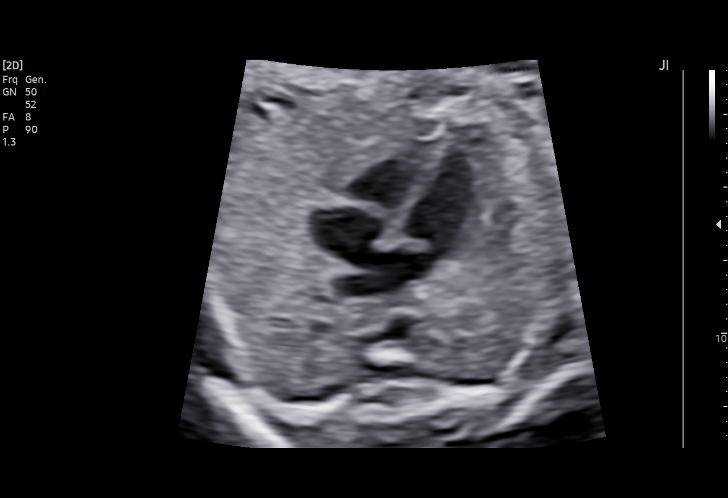
[im 21/37]
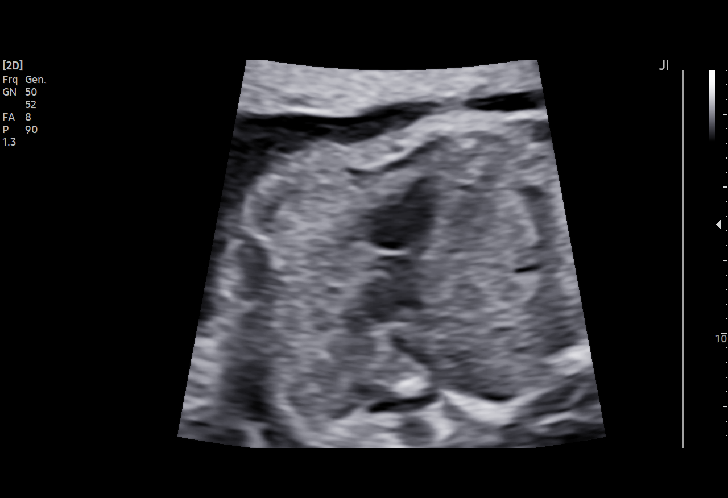
[im 23/37]
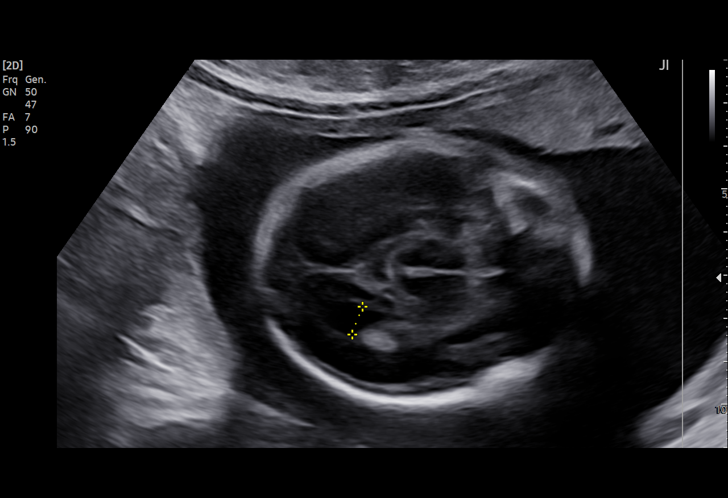
[im 26/37]
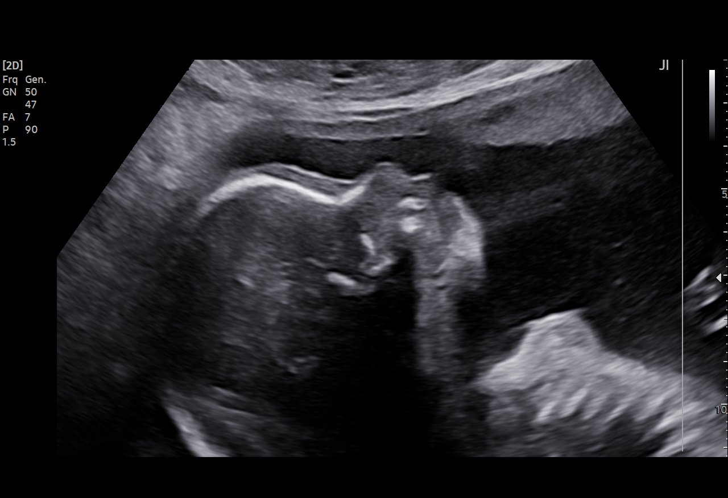
[im 29/37]
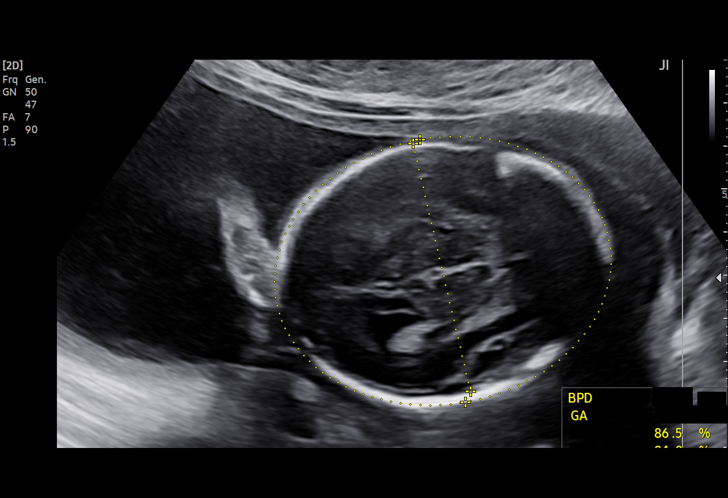
[im 31/37]
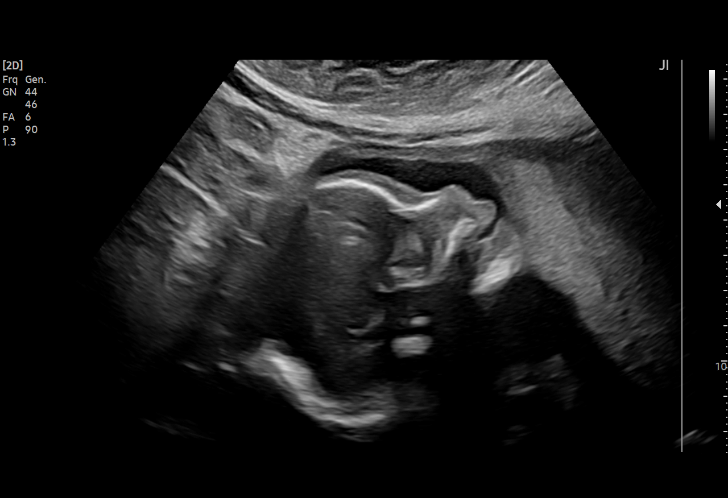
[im 34/37]
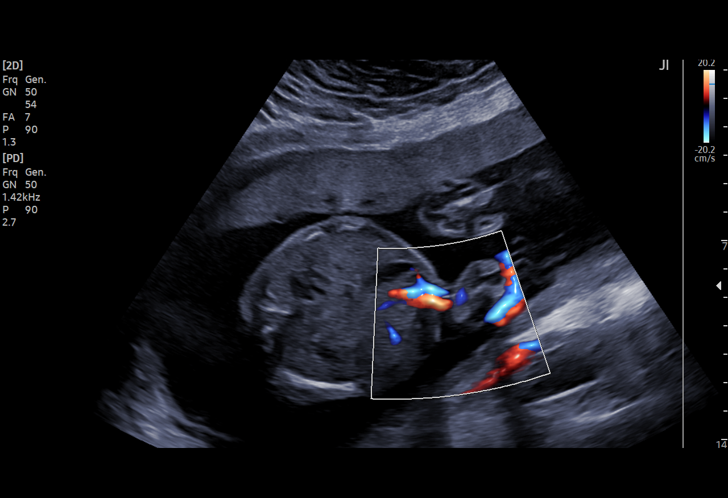
[im 37/37]
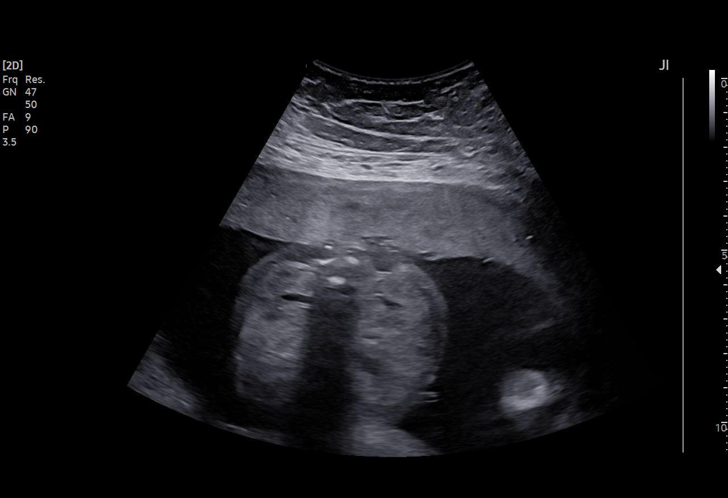

[14 of 28 positions shown; findings below may reference images not displayed]

[REDACTED]

Indications

 22 weeks gestation of pregnancy
 Obesity complicating pregnancy, second         [JF]
 trimester (BMI 44.43)
 Antenatal follow-up for nonvisualized fetal    [JF]
 anatomy
 Maternal Crohn's disease affecting             [JF]
 pregnancy in second trimester
 Bipolar disease during pregnancy               [JF] [JF]
 NIPS low risk
Fetal Evaluation

 Num Of Fetuses:         1
 Fetal Heart Rate(bpm):  139
 Cardiac Activity:       Observed
 Presentation:           Breech
 Placenta:               Anterior
 P. Cord Insertion:      Visualized, central

 Amniotic Fluid
 AFI FV:      Within normal limits

                             Largest Pocket(cm)

Biometry

 BPD:     58.37  mm     G. Age:  23w 6d         82  %    CI:        70.79   %    70 - 86
                                                         FL/HC:      17.3   %    19.2 -
 HC:    221.09   mm     G. Age:  24w 1d         83  %    HC/AC:      1.19        1.05 -
 AC:    185.97   mm     G. Age:  23w 3d         59  %    FL/BPD:     65.6   %    71 - 87
 FL:      38.31  mm     G. Age:  22w 2d         21  %    FL/AC:      20.6   %    20 - 24

 LV:        6.9  mm

 Est. FW:     559  gm      1 lb 4 oz     53  %
OB History

 Gravidity:    2          SAB:   1
Gestational Age

 LMP:           22w 6d        Date:  [DATE]                  EDD:   [DATE]
 U/S Today:     23w 3d                                        EDD:   [DATE]
 Best:          22w 6d     Det. By:  LMP  ([DATE])          EDD:   [DATE]
Anatomy

 Cranium:               Appears normal         Aortic Arch:            Previously seen
 Cavum:                 Appears normal         Ductal Arch:            Previously seen
 Ventricles:            Appears normal         Diaphragm:              Appears normal
 Choroid Plexus:        Previously seen        Stomach:                Appears normal, left
                                                                       sided
 Cerebellum:            Appears normal         Abdomen:                Appears normal
 Posterior Fossa:       Appears normal         Abdominal Wall:         Appears nml (cord
                                                                       insert, abd wall)
 Nuchal Fold:           Previously seen        Cord Vessels:           Appears normal (3
                                                                       vessel cord)
 Face:                  Appears normal         Kidneys:                Appear normal
                        (orbits and profile)
 Lips:                  Previously seen        Bladder:                Appears normal
 Thoracic:              Appears normal         Spine:                  Previously seen
 Heart:                 Appears normal         Upper Extremities:      Previously seen
                        (4CH, axis, and
                        situs)
 RVOT:                  Appears normal         Lower Extremities:      Previously seen
 LVOT:                  Appears normal

 Other:  VC, 3VV and 3VTV visualized.Fetus appears to be a male.Nasal
         bone, lenses, maxilla, mandible and falx previously visualized Hands
         visualized.
Impression

 Patient returned for completion of fetal anatomy .Amniotic
 fluid is normal and good fetal activity is seen .Fetal biometry
 is consistent with her previously-established dates .Fetal
 anatomical survey was completed and appears normal.
Recommendations
 -Fetal growth assessments every 4 weeks that may be
 performed at your office.
                SAGAR

## 2022-01-02 LAB — OB RESULTS CONSOLE GBS: GBS: NEGATIVE

## 2022-01-14 ENCOUNTER — Telehealth (HOSPITAL_COMMUNITY): Payer: Self-pay | Admitting: *Deleted

## 2022-01-14 ENCOUNTER — Encounter (HOSPITAL_COMMUNITY): Payer: Self-pay | Admitting: *Deleted

## 2022-01-14 NOTE — Telephone Encounter (Signed)
Preadmission screen  

## 2022-01-18 NOTE — H&P (Signed)
OB History and Physical Preadmission H&P for scheduled IOL.   Katrina Maddox is a 31 y.o. female G2P0010 presenting for IOL at [redacted]w[redacted]d.  Pregnancy is complicated by LGA, bipolar I, Crohn's disease.  Most recent US at [redacted]w[redacted]d Vertex, EFW = 4222g(7'5oz) = >97%, *AC & BPD > 97%, AFI = 18.9 cm = 70%.  Rh positive, GBS negative, panorama low risk.    OB History     Gravida  2   Para      Term      Preterm      AB  1   Living  0      SAB  1   IAB      Ectopic      Multiple      Live Births             Past Medical History:  Diagnosis Date   Anxiety    Asthma    Bipolar 1 disorder (HCC)    Crohn's colitis ([place])    Depression    Past Surgical History:  Procedure Laterality Date   DILATION AND CURETTAGE OF UTERUS     WISDOM TOOTH EXTRACTION     Family History: family history includes Cancer in her paternal grandfather. Social History:  reports that she has never smoked. She has never used smokeless tobacco. She reports that she does not currently use alcohol. She reports that she does not use drugs.     Maternal Diabetes: No Genetic Screening: Normal Maternal Ultrasounds/Referrals: Normal Fetal Ultrasounds or other Referrals:  None Maternal Substance Abuse:  No Significant Maternal Medications:  Abilify Significant Maternal Lab Results:  Group B Strep negative Other Comments:  None  Review of Systems - Patient denies fever, chills, SOB, CP, N/V/D.  History   Last menstrual period 04/19/2021. Exam Physical Exam   Gen: alert, well appearing, no distress Chest: nonlabored breathing CV: no peripheral edema Abdomen: soft, gravid  Ext: no evidence of DVT  Prenatal labs: ABO, Rh: A/Positive/-- (03/16 0000) Antibody: Negative (03/16 0000) Rubella: Immune (03/16 0000) RPR: Nonreactive (03/16 0000)  HBsAg: Negative (03/16 0000)  HIV: Non-reactive (03/16 0000)  GBS: Negative/-- (09/28 0000)   Assessment/Plan: Admit to Labor and Delivery Cytotec for  cervical ripening, followed by pitocin, AROM Epidural when desired Previously has been counseled on LGA and risks of macrosomia including shoulder dystocia, failure to progress.   Carlyon Shadow 01/18/2022, 9:10 PM

## 2022-01-19 ENCOUNTER — Inpatient Hospital Stay (HOSPITAL_COMMUNITY): Payer: No Typology Code available for payment source

## 2022-01-20 ENCOUNTER — Other Ambulatory Visit: Payer: Self-pay

## 2022-01-20 ENCOUNTER — Inpatient Hospital Stay (HOSPITAL_COMMUNITY)
Admission: AD | Admit: 2022-01-20 | Discharge: 2022-01-23 | DRG: 787 | Disposition: A | Discharge status: Home / Self Care | Payer: No Typology Code available for payment source | Attending: Obstetrics & Gynecology | Admitting: Obstetrics & Gynecology

## 2022-01-20 ENCOUNTER — Encounter (HOSPITAL_COMMUNITY): Payer: Self-pay | Admitting: Obstetrics and Gynecology

## 2022-01-20 DIAGNOSIS — O9962 Diseases of the digestive system complicating childbirth: Secondary | ICD-10-CM | POA: Diagnosis present

## 2022-01-20 DIAGNOSIS — K5090 Crohn's disease, unspecified, without complications: Secondary | ICD-10-CM | POA: Diagnosis present

## 2022-01-20 DIAGNOSIS — O3663X0 Maternal care for excessive fetal growth, third trimester, not applicable or unspecified: Secondary | ICD-10-CM | POA: Diagnosis present

## 2022-01-20 DIAGNOSIS — O26893 Other specified pregnancy related conditions, third trimester: Secondary | ICD-10-CM | POA: Diagnosis present

## 2022-01-20 DIAGNOSIS — O99214 Obesity complicating childbirth: Secondary | ICD-10-CM | POA: Diagnosis present

## 2022-01-20 DIAGNOSIS — O9952 Diseases of the respiratory system complicating childbirth: Secondary | ICD-10-CM | POA: Diagnosis not present

## 2022-01-20 DIAGNOSIS — Z3A39 39 weeks gestation of pregnancy: Secondary | ICD-10-CM | POA: Diagnosis not present

## 2022-01-20 DIAGNOSIS — Z98891 History of uterine scar from previous surgery: Secondary | ICD-10-CM

## 2022-01-20 DIAGNOSIS — J45909 Unspecified asthma, uncomplicated: Secondary | ICD-10-CM | POA: Diagnosis not present

## 2022-01-20 MED ORDER — LIDOCAINE HCL (PF) 1 % IJ SOLN: 30.0000 mL | INTRAMUSCULAR | Status: DC | PRN

## 2022-01-20 MED ORDER — TERBUTALINE SULFATE 1 MG/ML IJ SOLN: 0.2500 mg | Freq: Once | INTRAMUSCULAR | Status: DC | PRN

## 2022-01-20 MED ORDER — OXYCODONE-ACETAMINOPHEN 5-325 MG PO TABS: 2.0000 | ORAL_TABLET | ORAL | Status: DC | PRN

## 2022-01-20 MED ORDER — MISOPROSTOL 25 MCG QUARTER TABLET
25.0000 ug | ORAL_TABLET | ORAL | Status: DC
  Administered 2022-01-20: 25 ug via VAGINAL
  Filled 2022-01-20: qty 1

## 2022-01-20 MED ORDER — OXYTOCIN BOLUS FROM INFUSION: 333.0000 mL | Freq: Once | INTRAVENOUS | Status: DC

## 2022-01-20 MED ORDER — HYDROXYZINE HCL 50 MG PO TABS: 50.0000 mg | ORAL_TABLET | Freq: Four times a day (QID) | ORAL | Status: DC | PRN

## 2022-01-20 MED ORDER — LACTATED RINGERS IV SOLN: INTRAVENOUS | Status: DC

## 2022-01-20 MED ORDER — ONDANSETRON HCL 4 MG/2ML IJ SOLN: 4.0000 mg | Freq: Four times a day (QID) | INTRAMUSCULAR | Status: DC | PRN

## 2022-01-20 MED ORDER — OXYCODONE-ACETAMINOPHEN 5-325 MG PO TABS: 1.0000 | ORAL_TABLET | ORAL | Status: DC | PRN

## 2022-01-20 MED ORDER — ACETAMINOPHEN 325 MG PO TABS: 650.0000 mg | ORAL_TABLET | ORAL | Status: DC | PRN

## 2022-01-20 MED ORDER — LACTATED RINGERS IV SOLN: 500.0000 mL | INTRAVENOUS | Status: DC | PRN

## 2022-01-20 MED ORDER — SOD CITRATE-CITRIC ACID 500-334 MG/5ML PO SOLN
30.0000 mL | ORAL | Status: DC | PRN
  Filled 2022-01-20: qty 30

## 2022-01-20 MED ORDER — OXYTOCIN-SODIUM CHLORIDE 30-0.9 UT/500ML-% IV SOLN: 2.5000 [IU]/h | INTRAVENOUS | Status: DC

## 2022-01-21 ENCOUNTER — Encounter (HOSPITAL_COMMUNITY)
Admission: AD | Disposition: A | Discharge status: Home / Self Care | Payer: Self-pay | Source: Home / Self Care | Attending: Obstetrics & Gynecology

## 2022-01-21 ENCOUNTER — Encounter (HOSPITAL_COMMUNITY): Payer: Self-pay | Admitting: Obstetrics & Gynecology

## 2022-01-21 ENCOUNTER — Inpatient Hospital Stay (HOSPITAL_COMMUNITY): Payer: No Typology Code available for payment source

## 2022-01-21 ENCOUNTER — Other Ambulatory Visit: Payer: Self-pay

## 2022-01-21 ENCOUNTER — Inpatient Hospital Stay (HOSPITAL_COMMUNITY): Payer: No Typology Code available for payment source | Admitting: Certified Registered Nurse Anesthetist

## 2022-01-21 DIAGNOSIS — O9952 Diseases of the respiratory system complicating childbirth: Secondary | ICD-10-CM

## 2022-01-21 DIAGNOSIS — Z3A39 39 weeks gestation of pregnancy: Secondary | ICD-10-CM

## 2022-01-21 DIAGNOSIS — J45909 Unspecified asthma, uncomplicated: Secondary | ICD-10-CM

## 2022-01-21 DIAGNOSIS — Z98891 History of uterine scar from previous surgery: Secondary | ICD-10-CM

## 2022-01-21 LAB — RPR: RPR Ser Ql: NONREACTIVE

## 2022-01-21 LAB — CBC
HCT: 35.1 % — ABNORMAL LOW (ref 36.0–46.0)
Hemoglobin: 12.2 g/dL (ref 12.0–15.0)
MCH: 30.5 pg (ref 26.0–34.0)
MCHC: 34.8 g/dL (ref 30.0–36.0)
MCV: 87.8 fL (ref 80.0–100.0)
Platelets: 223 10*3/uL (ref 150–400)
RBC: 4 MIL/uL (ref 3.87–5.11)
RDW: 12.5 % (ref 11.5–15.5)
WBC: 12 10*3/uL — ABNORMAL HIGH (ref 4.0–10.5)
nRBC: 0 % (ref 0.0–0.2)

## 2022-01-21 LAB — TYPE AND SCREEN
ABO/RH(D): A POS
Antibody Screen: NEGATIVE

## 2022-01-21 SURGERY — Surgical Case
Anesthesia: Spinal | Wound class: Clean Contaminated

## 2022-01-21 MED ORDER — ACETAMINOPHEN 500 MG PO TABS
1000.0000 mg | ORAL_TABLET | Freq: Four times a day (QID) | ORAL | Status: DC
  Administered 2022-01-21 – 2022-01-23 (×7): 1000 mg via ORAL
  Filled 2022-01-21 (×5): qty 2

## 2022-01-21 MED ORDER — OXYTOCIN-SODIUM CHLORIDE 30-0.9 UT/500ML-% IV SOLN
2.5000 [IU]/h | INTRAVENOUS | Status: AC
  Filled 2022-01-21: qty 500

## 2022-01-21 MED ORDER — NALOXONE HCL 4 MG/10ML IJ SOLN: 1.0000 ug/kg/h | INTRAVENOUS | Status: DC | PRN

## 2022-01-21 MED ORDER — DIPHENHYDRAMINE HCL 25 MG PO CAPS: 25.0000 mg | ORAL_CAPSULE | ORAL | Status: DC | PRN

## 2022-01-21 MED ORDER — FENTANYL CITRATE (PF) 100 MCG/2ML IJ SOLN: 25.0000 ug | INTRAMUSCULAR | Status: DC | PRN

## 2022-01-21 MED ORDER — ONDANSETRON HCL 4 MG/2ML IJ SOLN
INTRAMUSCULAR | Status: DC | PRN
  Administered 2022-01-21: 4 mg via INTRAVENOUS

## 2022-01-21 MED ORDER — DIPHENHYDRAMINE HCL 25 MG PO CAPS: 25.0000 mg | ORAL_CAPSULE | Freq: Four times a day (QID) | ORAL | Status: DC | PRN

## 2022-01-21 MED ORDER — SIMETHICONE 80 MG PO CHEW
80.0000 mg | CHEWABLE_TABLET | Freq: Three times a day (TID) | ORAL | Status: DC
  Administered 2022-01-21 – 2022-01-23 (×6): 80 mg via ORAL
  Filled 2022-01-21 (×6): qty 1

## 2022-01-21 MED ORDER — KETOROLAC TROMETHAMINE 30 MG/ML IJ SOLN
30.0000 mg | Freq: Four times a day (QID) | INTRAMUSCULAR | Status: AC | PRN
  Administered 2022-01-21 – 2022-01-22 (×3): 30 mg via INTRAVENOUS
  Filled 2022-01-21 (×2): qty 1

## 2022-01-21 MED ORDER — DEXAMETHASONE SODIUM PHOSPHATE 10 MG/ML IJ SOLN
INTRAMUSCULAR | Status: DC | PRN
  Administered 2022-01-21: 10 mg via INTRAVENOUS

## 2022-01-21 MED ORDER — KETOROLAC TROMETHAMINE 30 MG/ML IJ SOLN
INTRAMUSCULAR | Status: AC
  Filled 2022-01-21: qty 1

## 2022-01-21 MED ORDER — NALOXONE HCL 0.4 MG/ML IJ SOLN: 0.4000 mg | INTRAMUSCULAR | Status: DC | PRN

## 2022-01-21 MED ORDER — PHENYLEPHRINE HCL-NACL 20-0.9 MG/250ML-% IV SOLN
INTRAVENOUS | Status: AC
  Filled 2022-01-21: qty 250

## 2022-01-21 MED ORDER — LACTATED RINGERS IV SOLN: INTRAVENOUS | Status: DC

## 2022-01-21 MED ORDER — STERILE WATER FOR IRRIGATION IR SOLN
Status: DC | PRN
  Administered 2022-01-21: 1000 mL

## 2022-01-21 MED ORDER — ACETAMINOPHEN 10 MG/ML IV SOLN: 1000.0000 mg | Freq: Once | INTRAVENOUS | Status: DC | PRN

## 2022-01-21 MED ORDER — SCOPOLAMINE 1 MG/3DAYS TD PT72: 1.0000 | MEDICATED_PATCH | Freq: Once | TRANSDERMAL | Status: DC

## 2022-01-21 MED ORDER — DEXAMETHASONE SODIUM PHOSPHATE 10 MG/ML IJ SOLN
INTRAMUSCULAR | Status: AC
  Filled 2022-01-21: qty 1

## 2022-01-21 MED ORDER — SENNOSIDES-DOCUSATE SODIUM 8.6-50 MG PO TABS
2.0000 | ORAL_TABLET | Freq: Every day | ORAL | Status: DC
  Administered 2022-01-22: 2 via ORAL
  Filled 2022-01-21: qty 2

## 2022-01-21 MED ORDER — BUPIVACAINE IN DEXTROSE 0.75-8.25 % IT SOLN
INTRATHECAL | Status: DC | PRN
  Administered 2022-01-21: 1.6 mL via INTRATHECAL

## 2022-01-21 MED ORDER — SCOPOLAMINE 1 MG/3DAYS TD PT72
MEDICATED_PATCH | TRANSDERMAL | Status: DC | PRN
  Administered 2022-01-21: 1 via TRANSDERMAL

## 2022-01-21 MED ORDER — OXYCODONE HCL 5 MG PO TABS
5.0000 mg | ORAL_TABLET | ORAL | Status: DC | PRN
  Administered 2022-01-21 – 2022-01-23 (×6): 10 mg via ORAL
  Filled 2022-01-21 (×6): qty 2

## 2022-01-21 MED ORDER — MORPHINE SULFATE (PF) 0.5 MG/ML IJ SOLN
INTRAMUSCULAR | Status: DC | PRN
  Administered 2022-01-21: .15 mg via INTRATHECAL

## 2022-01-21 MED ORDER — KETOROLAC TROMETHAMINE 30 MG/ML IJ SOLN: 30.0000 mg | Freq: Four times a day (QID) | INTRAMUSCULAR | Status: AC | PRN

## 2022-01-21 MED ORDER — ONDANSETRON HCL 4 MG/2ML IJ SOLN: 4.0000 mg | Freq: Three times a day (TID) | INTRAMUSCULAR | Status: DC | PRN

## 2022-01-21 MED ORDER — FENTANYL CITRATE (PF) 100 MCG/2ML IJ SOLN
INTRAMUSCULAR | Status: DC | PRN
  Administered 2022-01-21: 15 ug via INTRATHECAL

## 2022-01-21 MED ORDER — PRENATAL MULTIVITAMIN CH
1.0000 | ORAL_TABLET | Freq: Every day | ORAL | Status: DC
  Administered 2022-01-21 – 2022-01-22 (×2): 1 via ORAL
  Filled 2022-01-21 (×2): qty 1

## 2022-01-21 MED ORDER — MENTHOL 3 MG MT LOZG: 1.0000 | LOZENGE | OROMUCOSAL | Status: DC | PRN

## 2022-01-21 MED ORDER — CEFAZOLIN IN SODIUM CHLORIDE 3-0.9 GM/100ML-% IV SOLN
3.0000 g | Freq: Once | INTRAVENOUS | Status: AC
  Administered 2022-01-21: 3 g via INTRAVENOUS

## 2022-01-21 MED ORDER — DIPHENHYDRAMINE HCL 50 MG/ML IJ SOLN: 12.5000 mg | INTRAMUSCULAR | Status: DC | PRN

## 2022-01-21 MED ORDER — SIMETHICONE 80 MG PO CHEW: 80.0000 mg | CHEWABLE_TABLET | ORAL | Status: DC | PRN

## 2022-01-21 MED ORDER — ONDANSETRON HCL 4 MG/2ML IJ SOLN
INTRAMUSCULAR | Status: AC
  Filled 2022-01-21: qty 2

## 2022-01-21 MED ORDER — SCOPOLAMINE 1 MG/3DAYS TD PT72
MEDICATED_PATCH | TRANSDERMAL | Status: AC
  Filled 2022-01-21: qty 1

## 2022-01-21 MED ORDER — TETANUS-DIPHTH-ACELL PERTUSSIS 5-2.5-18.5 LF-MCG/0.5 IM SUSY: 0.5000 mL | PREFILLED_SYRINGE | Freq: Once | INTRAMUSCULAR | Status: DC

## 2022-01-21 MED ORDER — ZOLPIDEM TARTRATE 5 MG PO TABS: 5.0000 mg | ORAL_TABLET | Freq: Every evening | ORAL | Status: DC | PRN

## 2022-01-21 MED ORDER — COCONUT OIL OIL: 1.0000 | TOPICAL_OIL | Status: DC | PRN

## 2022-01-21 MED ORDER — PHENYLEPHRINE HCL-NACL 20-0.9 MG/250ML-% IV SOLN
INTRAVENOUS | Status: DC | PRN
  Administered 2022-01-21: 60 ug/min via INTRAVENOUS

## 2022-01-21 MED ORDER — OXYTOCIN-SODIUM CHLORIDE 30-0.9 UT/500ML-% IV SOLN
INTRAVENOUS | Status: DC | PRN
  Administered 2022-01-21: 30 [IU] via INTRAVENOUS

## 2022-01-21 MED ORDER — ACETAMINOPHEN 500 MG PO TABS
1000.0000 mg | ORAL_TABLET | Freq: Four times a day (QID) | ORAL | Status: AC
  Administered 2022-01-21: 1000 mg via ORAL
  Filled 2022-01-21 (×3): qty 2

## 2022-01-21 MED ORDER — OXYTOCIN-SODIUM CHLORIDE 30-0.9 UT/500ML-% IV SOLN
INTRAVENOUS | Status: AC
  Filled 2022-01-21: qty 500

## 2022-01-21 MED ORDER — SODIUM CHLORIDE 0.9 % IV SOLN
INTRAVENOUS | Status: AC
  Filled 2022-01-21: qty 5

## 2022-01-21 MED ORDER — MORPHINE SULFATE (PF) 0.5 MG/ML IJ SOLN
INTRAMUSCULAR | Status: AC
  Filled 2022-01-21: qty 10

## 2022-01-21 MED ORDER — SODIUM CHLORIDE 0.9% FLUSH: 3.0000 mL | INTRAVENOUS | Status: DC | PRN

## 2022-01-21 MED ORDER — SODIUM CHLORIDE 0.9 % IR SOLN
Status: DC | PRN
  Administered 2022-01-21: 1000 mL

## 2022-01-21 MED ORDER — FENTANYL CITRATE (PF) 100 MCG/2ML IJ SOLN
INTRAMUSCULAR | Status: AC
  Filled 2022-01-21: qty 2

## 2022-01-21 SURGICAL SUPPLY — 31 items
BENZOIN TINCTURE PRP APPL 2/3 (GAUZE/BANDAGES/DRESSINGS) ×1 IMPLANT
CHLORAPREP W/TINT 26ML (MISCELLANEOUS) ×2 IMPLANT
CLAMP UMBILICAL CORD (MISCELLANEOUS) ×1 IMPLANT
CLOTH BEACON ORANGE TIMEOUT ST (SAFETY) ×1 IMPLANT
DERMABOND ADVANCED .7 DNX12 (GAUZE/BANDAGES/DRESSINGS) IMPLANT
DRSG OPSITE POSTOP 4X10 (GAUZE/BANDAGES/DRESSINGS) ×1 IMPLANT
ELECT REM PT RETURN 9FT ADLT (ELECTROSURGICAL)
ELECTRODE REM PT RTRN 9FT ADLT (ELECTROSURGICAL) ×1 IMPLANT
EXTRACTOR VACUUM KIWI (MISCELLANEOUS) IMPLANT
GAUZE SPONGE 4X4 12PLY STRL LF (GAUZE/BANDAGES/DRESSINGS) IMPLANT
GLOVE BIO SURGEON STRL SZ 6 (GLOVE) ×1 IMPLANT
GLOVE BIOGEL PI IND STRL 6 (GLOVE) ×2 IMPLANT
GLOVE BIOGEL PI IND STRL 7.0 (GLOVE) ×1 IMPLANT
GOWN STRL REUS W/TWL LRG LVL3 (GOWN DISPOSABLE) ×2 IMPLANT
KIT ABG SYR 3ML LUER SLIP (SYRINGE) ×1 IMPLANT
NDL HYPO 25X5/8 SAFETYGLIDE (NEEDLE) ×1 IMPLANT
NEEDLE HYPO 25X5/8 SAFETYGLIDE (NEEDLE) ×1 IMPLANT
NS IRRIG 1000ML POUR BTL (IV SOLUTION) ×1 IMPLANT
PACK C SECTION WH (CUSTOM PROCEDURE TRAY) ×1 IMPLANT
PAD ABD 7.5X8 STRL (GAUZE/BANDAGES/DRESSINGS) IMPLANT
PAD OB MATERNITY 4.3X12.25 (PERSONAL CARE ITEMS) ×1 IMPLANT
STRIP CLOSURE SKIN 1/2X4 (GAUZE/BANDAGES/DRESSINGS) IMPLANT
SUT CHROMIC 0 CTX 36 (SUTURE) ×3 IMPLANT
SUT MON AB 2-0 CT1 27 (SUTURE) ×1 IMPLANT
SUT PDS AB 0 CT1 27 (SUTURE) IMPLANT
SUT PLAIN 0 NONE (SUTURE) IMPLANT
SUT VIC AB 0 CT1 36 (SUTURE) IMPLANT
SUT VIC AB 4-0 KS 27 (SUTURE) IMPLANT
TOWEL OR 17X24 6PK STRL BLUE (TOWEL DISPOSABLE) ×1 IMPLANT
TRAY FOLEY W/BAG SLVR 14FR LF (SET/KITS/TRAYS/PACK) IMPLANT
WATER STERILE IRR 1000ML POUR (IV SOLUTION) ×1 IMPLANT

## 2022-01-21 NOTE — Lactation Note (Signed)
This note was copied from a baby's chart. Lactation Consultation Note  Patient Name: Katrina Maddox [id number] Date: 01/21/2022 Reason for consult: Initial assessment;Term;Primapara;1st time breastfeeding Age:29 hours   P1: Term infant at 39+4 weeks Feeding preference: Breast  RN in room completing measurements; "[name]" awakened; offered to assist with latching and birth parent receptive.  Taught hand expression and finger fed colostrum to baby.  Assisted to latch in the football hold easily, however, "[name]" was not interested in taking more than a few sucks.  Demonstrated gentle stimulation and breast compressions.  Encouraged to feed 8-12 times/24 hours or sooner if baby shows feeding cues.  Suggested she call her RN/LC for latch assistance as needed.  Visitors and support person present.     Maternal Data Does the patient have breastfeeding experience prior to this delivery?: No  Feeding Mother's Current Feeding Choice: Breast Milk  LATCH Score Latch: Repeated attempts needed to sustain latch, nipple held in mouth throughout feeding, stimulation needed to elicit sucking reflex.  Audible Swallowing: None  Type of Nipple: Everted at rest and after stimulation  Comfort (Breast/Nipple): Soft / non-tender  Hold (Positioning): Assistance needed to correctly position infant at breast and maintain latch.  LATCH Score: 6   Lactation Tools Discussed/Used    Interventions Interventions: Breast feeding basics reviewed;Assisted with latch;Skin to skin;Breast massage;Hand express;Breast compression;Position options;Support pillows;Adjust position;Education;LC Services brochure  Discharge Pump: Personal;Manual  Consult Status Consult Status: Follow-up Date: 01/22/22 Follow-up type: In-patient    [name] 01/21/2022, 12:35 PM

## 2022-01-21 NOTE — Op Note (Signed)
Katrina Maddox PROCEDURE DATE: 01/20/2022 - 01/21/2022  PREOPERATIVE DIAGNOSIS: Intrauterine pregnancy at  [redacted]w[redacted]d weeks gestation, non-reassuring fetal monitoring remote from delivery, suspected fetal macrosomia  POSTOPERATIVE DIAGNOSIS: The same  PROCEDURE:  Primary Low Transverse Cesarean Section  SURGEON:  Dr. Linda Hedges  INDICATIONS: Katrina Maddox is a 31 y.o. G2P0010 at [redacted]w[redacted]d scheduled for cesarean section secondary to non-reassuring fetal monitoring remote from delivery.  Repetitive prolonged decelerations.  The risks of cesarean section discussed with the patient included but were not limited to: bleeding which may require transfusion or reoperation; infection which may require antibiotics; injury to bowel, bladder, ureters or other surrounding organs; injury to the fetus; need for additional procedures including hysterectomy in the event of a life-threatening hemorrhage; placental abnormalities wth subsequent pregnancies, incisional problems, thromboembolic phenomenon and other postoperative/anesthesia complications. The patient concurred with the proposed plan, giving informed written consent for the procedure.    FINDINGS:  Viable female infant in cephalic presentation, APGARs 8,9:  weight pending  Clear amniotic fluid.  Intact placenta, three vessel cord.  Grossly normal uterus, ovaries and fallopian tubes. .   ANESTHESIA:  Spinal ESTIMATED BLOOD LOSS: 627 mL ml SPECIMENS: Placenta sent to L&D COMPLICATIONS: None immediate  PROCEDURE IN DETAIL:  The patient received intravenous antibiotics and had sequential compression devices applied to her lower extremities while in the preoperative area.  She was then taken to the operating room where spinal anesthesia was administered and was found to be adequate. She was then placed in a dorsal supine position with a leftward tilt, and prepped and draped in a sterile manner.  A foley catheter was placed into her bladder and attached to constant  gravity.  After an adequate timeout was performed, a Pfannenstiel skin incision was made with scalpel and carried through to the underlying layer of fascia. The fascia was incised in the midline and this incision was extended bilaterally using the Mayo scissors. Kocher clamps were applied to the superior aspect of the fascial incision and the underlying rectus muscles were dissected off bluntly. A similar process was carried out on the inferior aspect of the facial incision. The rectus muscles were separated in the midline bluntly and the peritoneum was entered bluntly.  Bladder flap was created sharply and developed bluntly.  Bladder blade was placed.  A transverse hysterotomy was made with a scalpel and extended bilaterally bluntly. The bladder blade was then removed. The infant was successfully delivered, and cord was clamped and cut and infant was handed over to awaiting neonatology team. Uterine massage was then administered and the placenta delivered intact with three-vessel cord. The uterus was cleared of clot and debris.  The hysterotomy was closed with 0 chromic.  A second imbricating suture of 0-chromic was used to reinforce the incision and aid in hemostasis.  The peritoneum and rectus muscles were noted to be hemostatic and were reapproximated using 2-0 monocryl in a running fashion.  The fascia was closed with 0-Vicryl in a running fashion with good restoration of anatomy.  The subcutaneus tissue was copiously irrigated.  The skin was closed with 4-0 vicryl in a subcuticular fashion.  Pt tolerated the procedure will.  All counts were correct x2.  Pt went to the recovery room in stable condition.

## 2022-01-21 NOTE — Anesthesia Preprocedure Evaluation (Signed)
Anesthesia Evaluation  Patient identified by MRN, date of birth, ID band Patient awake    Reviewed: Allergy & Precautions, NPO status , Patient's Chart, lab work & pertinent test results  Airway Mallampati: III  TM Distance: >3 FB Neck ROM: Full    Dental no notable dental hx.    Pulmonary asthma ,    Pulmonary exam normal breath sounds clear to auscultation       Cardiovascular negative cardio ROS Normal cardiovascular exam Rhythm:Regular Rate:Normal     Neuro/Psych PSYCHIATRIC DISORDERS Anxiety Depression Bipolar Disorder negative neurological ROS     GI/Hepatic negative GI ROS, Neg liver ROS, Crohn's   Endo/Other  Morbid obesity (BMI 46)  Renal/GU negative Renal ROS  negative genitourinary   Musculoskeletal negative musculoskeletal ROS (+)   Abdominal   Peds  Hematology negative hematology ROS (+)   Anesthesia Other Findings Primary C/S for LGA  Reproductive/Obstetrics (+) Pregnancy                             Anesthesia Physical Anesthesia Plan  ASA: 3  Anesthesia Plan: Spinal   Post-op Pain Management: Ofirmev IV (intra-op)*   Induction:   PONV Risk Score and Plan: 2 and Treatment may vary due to age or medical condition, Dexamethasone and Ondansetron  Airway Management Planned: Natural Airway  Additional Equipment:   Intra-op Plan:   Post-operative Plan:   Informed Consent: I have reviewed the patients History and Physical, chart, labs and discussed the procedure including the risks, benefits and alternatives for the proposed anesthesia with the patient or authorized representative who has indicated his/her understanding and acceptance.     Dental advisory given  Plan Discussed with: Anesthesiologist  Anesthesia Plan Comments: (Patient identified. Risks, benefits, options discussed with patient including but not limited to bleeding, infection, nerve damage,  paralysis, failed block, incomplete pain control, headache, blood pressure changes, nausea, vomiting, reactions to medication, itching, and post partum back pain. Confirmed with bedside nurse the patient's most recent platelet count. Confirmed with the patient that they are not taking any anticoagulation, have any bleeding history or any family history of bleeding disorders. Patient expressed understanding and wishes to proceed. All questions were answered. )        Anesthesia Quick Evaluation

## 2022-01-21 NOTE — Transfer of Care (Signed)
Immediate Anesthesia Transfer of Care Note  Patient: Katrina Maddox  Procedure(s) Performed: CESAREAN SECTION  Patient Location: PACU  Anesthesia Type:Spinal  Level of Consciousness: awake, alert  and oriented  Airway & Oxygen Therapy: Patient Spontanous Breathing  Post-op Assessment: Report given to RN and Post -op Vital signs reviewed and stable  Post vital signs: Reviewed and stable  Last Vitals:  Vitals Value Taken Time  BP    Temp    Pulse    Resp    SpO2      Last Pain:  Vitals:   01/21/22 0040  TempSrc: Oral         Complications: No notable events documented.

## 2022-01-21 NOTE — Progress Notes (Signed)
Patient was originally scheduled for elective IOL on 10/15 but was not called in until last night.  She received vaginal misoprostol 25 mcg at 2303.  I received a call from RN at [facility] that patient had 2 prolonged decelerations and she had been repositioned and IV fluid bolus given.  I immediately reviewed strip and found that approximately every hour since admission, prolonged deceleration is noted and worsening over time.  Last prolonged deceleration was at 0407 and lasted x 6 minutes to nadir of 75 bpm.  Repositioning and fluid bolus allowed recovery and FHT Cat I at this time.  Cervix is 1/th/-3.  Baby is suspected macrosomia with last U/S on 10/5 EFW 4222g (9#5).    Patient is counseled that given repetitive prolonged decelerations remote from delivery and suspected fetal macrosomia, I recommend C/S for delivery.  She is offered waiting to see if another prolonged occurs but elects to proceed to C/S at this time.  She is informed of risk of bleeding, infection, scarring and damage to surrounding structures. She is informed of implications in future pregnancies including risk of abnormal placentation and uterine rupture.  All questions were answered and patient wishes to proceed.  Linda Hedges, DO

## 2022-01-21 NOTE — Anesthesia Postprocedure Evaluation (Signed)
Anesthesia Post Note  Patient: Katrina Maddox  Procedure(s) Performed: CESAREAN SECTION     Patient location during evaluation: PACU Anesthesia Type: Spinal Level of consciousness: oriented and awake and alert Pain management: pain level controlled Vital Signs Assessment: post-procedure vital signs reviewed and stable Respiratory status: spontaneous breathing, respiratory function stable and patient connected to nasal cannula oxygen Cardiovascular status: blood pressure returned to baseline and stable Postop Assessment: no headache, no backache and no apparent nausea or vomiting Anesthetic complications: no   No notable events documented.  Last Vitals:  Vitals:   01/21/22 1320 01/21/22 1740  BP: 133/77 132/76  Pulse: 88 84  Resp: 16 16  Temp: 36.7 C 36.7 C  SpO2:      Last Pain:  Vitals:   01/21/22 1320  TempSrc: Oral  PainSc:    Pain Goal:                   Kioni Stahl L Ikeisha Blumberg

## 2022-01-21 NOTE — Anesthesia Procedure Notes (Signed)
Spinal  Patient location during procedure: OR Start time: 01/21/2022 5:26 AM End time: 01/21/2022 5:28 AM Reason for block: surgical anesthesia Staffing Performed: anesthesiologist  Anesthesiologist: Freddrick March, MD Performed by: Freddrick March, MD Authorized by: Freddrick March, MD   Preanesthetic Checklist Completed: patient identified, IV checked, risks and benefits discussed, surgical consent, monitors and equipment checked, pre-op evaluation and timeout performed Spinal Block Patient position: sitting Prep: DuraPrep and site prepped and draped Patient monitoring: cardiac monitor, continuous pulse ox and blood pressure Approach: midline Location: L3-4 Injection technique: single-shot Needle Needle type: Pencan  Needle gauge: 24 G Needle length: 9 cm Assessment Sensory level: T6 Events: CSF return Additional Notes Functioning IV was confirmed and monitors were applied. Sterile prep and drape, including hand hygiene and sterile gloves were used. The patient was positioned and the spine was prepped. The skin was anesthetized with lidocaine.  Free flow of clear CSF was obtained prior to injecting local anesthetic into the CSF.  The spinal needle aspirated freely following injection.  The needle was carefully withdrawn.  The patient tolerated the procedure well.

## 2022-01-22 ENCOUNTER — Inpatient Hospital Stay (HOSPITAL_COMMUNITY): Admission: RE | Admit: 2022-01-22 | Payer: No Typology Code available for payment source | Source: Ambulatory Visit

## 2022-01-22 LAB — CBC
HCT: 26.7 % — ABNORMAL LOW (ref 36.0–46.0)
Hemoglobin: 9 g/dL — ABNORMAL LOW (ref 12.0–15.0)
MCH: 29.8 pg (ref 26.0–34.0)
MCHC: 33.7 g/dL (ref 30.0–36.0)
MCV: 88.4 fL (ref 80.0–100.0)
Platelets: 181 10*3/uL (ref 150–400)
RBC: 3.02 MIL/uL — ABNORMAL LOW (ref 3.87–5.11)
RDW: 12.4 % (ref 11.5–15.5)
WBC: 15.9 10*3/uL — ABNORMAL HIGH (ref 4.0–10.5)
nRBC: 0 % (ref 0.0–0.2)

## 2022-01-22 NOTE — Clinical Social Work Maternal (Signed)
CLINICAL SOCIAL WORK MATERNAL/CHILD NOTE  Patient Details  Name: Katrina Maddox MRN: [id number] Date of Birth: 1990/09/22  Date:  01/22/2022  Clinical Social Worker Initiating Note:  Letta Kocher, LCSWA Date/Time: Initiated:  01/22/22/1452     Child's Name:  Katrina Maddox   Biological Parents:  Mother, Father Burdin Scarlett 1990-07-24, Bufford Spikes)   Need for Interpreter:  None   Reason for Referral:  Behavioral Health Concerns   Address:  [place] [address]    Phone number:  [phone] (home)     Additional phone number:   Household Members/Support Persons (HM/SP):   Household Member/Support Person 1   HM/SP Name Relationship DOB or Age  HM/SP -[place] spouse    HM/SP -2        HM/SP -3        HM/SP -4        HM/SP -5        HM/SP -6        HM/SP -7        HM/SP -8          Natural Supports (not living in the home):  Extended Family   Professional Supports:     Employment: Unemployed   Type of Work:     Education:      Homebound arranged:    Pensions consultant:      Other Resources:      Cultural/Religious Considerations Which May Impact Care:    Strengths:  Ability to meet basic needs  , Home prepared for child  , Pediatrician chosen   Psychotropic Medications:         Pediatrician:    [facility] (including [place])  Pediatrician List:   [facility] Other [facility])    Pediatrician Fax Number:    Risk Factors/Current Problems:  Mental Health Concerns     Cognitive State:  Alert  , Able to Concentrate     Mood/Affect:  Calm  , Comfortable  , Interested     CSW Assessment: CSW received Consult for hx anxiety and Bipolar. CSW met with MOB to complete assessment and offer support. When CSW entered the room, MOB was holding the infant and FOB was standing next to her supportively. CSW  introduced self, CSW role and reason for visit. MOB Was agreeable to visit. MOB granted CSW verbal permission to speak in front of FOB. MOB remained engaged throughout the assessment. CSW inquired about how MOB was feeling MOB reported she was feeling good, and the delivery went faster than she expected. CSW confirmed demographic information with MOB.   CSW inquired about MOB MH hx MOB reported she was diagnosed in 2018. Mob reported she takes Geodon for the Bipolar and hydroxyzine for her anxiety and to sleep. MOB reported the medication has been beneficial. MOB reported she sees a psychiatrist every 3 weeks and plans to start :talk therapy"  soon. With Dr. Domingo Cocking. MOB reported having a stable mood and stated, "I feel good". CSW assessed for safety, MOB denied any SI or HI.  MOB reported her supports are her spouse, therapist and family. CSW provided education regarding the baby blues period vs. perinatal mood disorders, discussed treatment and gave resources for mental health follow up if concerns arise.  CSW recommends self-evaluation during the postpartum time period using the New Mom Checklist  from Postpartum Progress and encouraged MOB to contact a medical professional if symptoms are noted at any time.    CSW provided review of Sudden Infant Death Syndrome (SIDS) precautions. MOB reported they have all necessary items for the infant including a bassinet for him to sleep. CSW identifies no further need for intervention and no barriers to discharge at this time.   CSW Plan/Description:  No Further Intervention Required/No Barriers to Discharge, Sudden Infant Death Syndrome (SIDS) Education, Perinatal Mood and Anxiety Disorder (PMADs) Education    Boris Sharper, LCSW 01/22/2022, 3:00 PM

## 2022-01-22 NOTE — Lactation Note (Signed)
This note was copied from a baby's chart. Lactation Consultation Note  Patient Name: Katrina Maddox [id number] Date: 01/22/2022 Reason for consult: Follow-up assessment;Term;Primapara;1st time breastfeeding Age:29 hours   P1: Term infant at 39+4 weeks Feeding preference: Breast/donor breast milk Weight loss: 6%  Discussed current feeding plan with birth parent.  "[name]" has not yet latched/fed at the breast.  Birth parent has been continuing to try, however, he remains very sleepy and uninterested.  "[name]" also received a circumcision this morning.  Explained that babies can be very sleepy for several hours after circumcision.  Due to the high weight loss at 24 hours and his disinterest in latching I suggested beginning to supplement.  Birth parent in agreement stating that she just wants to "see him feed."  Options present and she prefers to use donor milk.  RN provided consent and brought in donor milk.  Reviewed paced bottle feeding and volume supplementation guidelines with birth parent.  Offered to initiate the electric pump; parent receptive.  #21 flanges are appropriate at this time.  Reviewed pump, set up and cleaning.    Encouraged to feed on cue or at least every three hours due to weight loss.  Suggested she call her RN/LC for latch assistance as needed.  Birth parent very receptive to all teaching.  Two visitors present and supportive.    Maternal Data Has patient been taught Hand Expression?: Yes Does the patient have breastfeeding experience prior to this delivery?: No  Feeding Mother's Current Feeding Choice: Breast Milk and Donor Milk  LATCH Score Latch: Too sleepy or reluctant, no latch achieved, no sucking elicited.  Audible Swallowing: None  Type of Nipple: Everted at rest and after stimulation  Comfort (Breast/Nipple): Soft / non-tender  Hold (Positioning): Assistance needed to correctly position infant at breast and maintain latch.  LATCH Score:  5   Lactation Tools Discussed/Used Tools: Pump;Flanges;Coconut oil;Bottle Flange Size: 21 Breast pump type: Double-Electric Breast Pump;Manual Pump Education: Setup, frequency, and cleaning;Milk Storage Reason for Pumping: Breast stimulation for supplementation; baby not interested in latching Pumping frequency: Every three hours Pumped volume:  (drops)  Interventions Interventions: Breast feeding basics reviewed;Assisted with latch;Skin to skin;Breast massage;Hand express;Breast compression;Hand pump;Coconut oil;Expressed milk;Position options;Support pillows;Adjust position;DEBP;Education  Discharge Pump: Personal ([place]in[place])  Consult Status Consult Status: Follow-up Date: 01/23/22 Follow-up type: In-patient    Destinae Neubecker R Beva Remund 01/22/2022, 12:04 PM

## 2022-01-22 NOTE — Progress Notes (Signed)
Postpartum Progress Note  S: No complaints. Feeling well. Lochia appropriate. No subjective fevers/chills, Cp, or SOB. Ambulating.   O:  Vitals:   01/21/22 2130 01/22/22 0514  BP: 118/68 110/62  Pulse: 68 67  Resp: 18 18  Temp: 98.2 F (36.8 C) 98.2 F (36.8 C)  SpO2: 99% 99%   Gen: NAD, A&O Pulm: NWOB Abd: soft, appropriately ttp, fundus firm and below Umb. Incision c/d/I, honeycomb with strikethrough. Ext: No evidence of DVT, trace edema b/l  UOP adequate.   Labs Recent Results (from the past 2160 hour(s))  OB RESULT CONSOLE Group B Strep     Status: None   Collection Time: 01/02/22 12:00 AM  Result Value Ref Range   GBS Negative   CBC     Status: Abnormal   Collection Time: 01/20/22 11:18 PM  Result Value Ref Range   WBC 12.0 (H) 4.0 - 10.5 K/uL   RBC 4.00 3.87 - 5.11 MIL/uL   Hemoglobin 12.2 12.0 - 15.0 g/dL   HCT 35.1 (L) 36.0 - 46.0 %   MCV 87.8 80.0 - 100.0 fL   MCH 30.5 26.0 - 34.0 pg   MCHC 34.8 30.0 - 36.0 g/dL   RDW 12.5 11.5 - 15.5 %   Platelets 223 150 - 400 K/uL   nRBC 0.0 0.0 - 0.2 %    Comment: Performed at [facility], [place] [address], [place], [place] [address]  Type and screen [place]     Status: None   Collection Time: 01/20/22 11:18 PM  Result Value Ref Range   ABO/RH(D) A POS    Antibody Screen NEG    Sample Expiration      01/23/2022,2359 Performed at [facility], [place] [address] [place], [place] [address]   RPR     Status: None   Collection Time: 01/20/22 11:18 PM  Result Value Ref Range   RPR Ser Ql NON REACTIVE NON REACTIVE    Comment: Performed at [facility], [place] [address], [place], [place] [address]  CBC     Status: Abnormal   Collection Time: 01/22/22  4:59 AM  Result Value Ref Range   WBC 15.9 (H) 4.0 - 10.5 K/uL   RBC 3.02 (L) 3.87 - 5.11 MIL/uL   Hemoglobin 9.0 (L) 12.0 - 15.0 g/dL    Comment: REPEATED TO VERIFY   HCT 26.7 (L) 36.0 - 46.0 %   MCV 88.4 80.0 - 100.0 fL   MCH 29.8  26.0 - 34.0 pg   MCHC 33.7 30.0 - 36.0 g/dL   RDW 12.4 11.5 - 15.5 %   Platelets 181 150 - 400 K/uL   nRBC 0.0 0.0 - 0.2 %    Comment: Performed at [facility], [place] [address], [place], [place] [address]    A/P:  POD1 s/p pCS for nonreassuring FHT remote from delivery, doing well pp. AFVSS. Benign exam. Preg complicated by Bipolar 1 disorder, Crohn's disease  For PHQ9 Change honeycomb dressing Continue present care.  Plan for d/c POD#2 or 3.  For circ today.  Hurshel Party, MD

## 2022-01-23 MED ORDER — FERROUS SULFATE 325 (65 FE) MG PO TBEC: 325.0000 mg | DELAYED_RELEASE_TABLET | Freq: Two times a day (BID) | ORAL | 2 refills | Status: AC

## 2022-01-23 MED ORDER — OXYCODONE HCL 5 MG PO TABS: 5.0000 mg | ORAL_TABLET | ORAL | 0 refills | Status: AC | PRN

## 2022-01-23 MED ORDER — IBUPROFEN 600 MG PO TABS: 600.0000 mg | ORAL_TABLET | Freq: Four times a day (QID) | ORAL | 0 refills | Status: AC | PRN

## 2022-01-23 MED ORDER — DOCUSATE SODIUM 100 MG PO CAPS: 100.0000 mg | ORAL_CAPSULE | Freq: Two times a day (BID) | ORAL | 2 refills | Status: AC

## 2022-01-23 NOTE — Discharge Summary (Signed)
Postpartum Discharge Summary    Patient Name: Katrina Maddox DOB: 1990-09-17 MRN: [id number]  Date of admission: 01/20/2022 Delivery date:01/21/2022  Delivering provider: Linda Hedges  Date of discharge: 01/23/2022  Admitting diagnosis: Macrosomia [P08.0] S/P cesarean section [Z98.891] Intrauterine pregnancy: [redacted]w[redacted]d    Secondary diagnosis:  Principal Problem:   Macrosomia Active Problems:   S/P cesarean section  Additional problems: Bipolar disorder, Crohn's disease    Discharge diagnosis: Term Pregnancy Delivered                                              Post partum procedures: None Augmentation: Cytotec Complications: None  Hospital course: Induction of Labor With Cesarean Section   31y.o. yo G2P1011 at 361w4das admitted to the hospital 01/20/2022 for induction of labor. Patient had a labor course significant for nrFHT and suspected macrosomia. The patient went for cesarean section due to Non-Reassuring FHR. Delivery details are as follows: Membrane Rupture Time/Date: 5:51 AM ,01/21/2022   Delivery Method:C-Section, Low Transverse  Details of operation can be found in separate operative Note.  Patient had a postpartum course complicated bynothing. She is ambulating, tolerating a regular diet, passing flatus, and urinating well.  Patient is discharged home in stable condition on 01/23/22.      Newborn Data: Birth date:01/21/2022  Birth time:5:52 AM  Gender:Female  Living status:Living  Apgars:8 ,9  Weight:3940 g                                Magnesium Sulfate received: No BMZ received: No Rhophylac:N/A MMR:N/A T-DaP:Given prenatally Flu: N/A Transfusion:No  Physical exam  Vitals:   01/22/22 0514 01/22/22 1413 01/22/22 2107 01/23/22 0556  BP: 110/62 136/73 121/68 128/70  Pulse: 67 91 78 84  Resp: 18 19 18 18   Temp: 98.2 F (36.8 C) 98 F (36.7 C) 98.7 F (37.1 C) 98.4 F (36.9 C)  TempSrc: Oral Oral Oral Oral  SpO2: 99%     Weight:      Height:        General: alert and cooperative Lochia: appropriate Uterine Fundus: firm Incision: Healing well with no significant drainage DVT Evaluation: No evidence of DVT seen on physical exam. Labs: Lab Results  Component Value Date   WBC 15.9 (H) 01/22/2022   HGB 9.0 (L) 01/22/2022   HCT 26.7 (L) 01/22/2022   MCV 88.4 01/22/2022   PLT 181 01/22/2022      Latest Ref Rng & Units 02/18/2020    9:21 AM  CMP  Glucose 70 - 99 mg/dL 95   BUN 6 - 20 mg/dL 10   Creatinine 0.44 - 1.00 mg/dL 0.61   Sodium 135 - 145 mmol/L 138   Potassium 3.5 - 5.1 mmol/L 3.6   Chloride 98 - 111 mmol/L 104   CO2 22 - 32 mmol/L 22   Calcium 8.9 - 10.3 mg/dL 8.5   Total Protein 6.5 - 8.1 g/dL 7.4   Total Bilirubin 0.3 - 1.2 mg/dL 0.4   Alkaline Phos 38 - 126 U/L 52   AST 15 - 41 U/L 19   ALT 0 - 44 U/L 17    Edinburgh Score:    01/21/2022    9:29 AM  Edinburgh Postnatal Depression Scale Screening Tool  I have been able to laugh and  see the funny side of things. 0  I have looked forward with enjoyment to things. 0  I have blamed myself unnecessarily when things went wrong. 1  I have been anxious or worried for no good reason. 2  I have felt scared or panicky for no good reason. 1  Things have been getting on top of me. 0  I have been so unhappy that I have had difficulty sleeping. 0  I have felt sad or miserable. 0  I have been so unhappy that I have been crying. 0  The thought of harming myself has occurred to me. 0  Edinburgh Postnatal Depression Scale Total 4      After visit meds:  Allergies as of 01/23/2022       Reactions   Gelatin Hives, Nausea Only   Allergy resolved - Alpha gal cleared up   Ms Contin [morphine] Nausea Only   Other    Red meat Allergy resolved - Alpha gal cleared up        Medication List     STOP taking these medications    hydrOXYzine 10 MG tablet Commonly known as: ATARAX       TAKE these medications    docusate sodium 100 MG capsule Commonly  known as: Colace Take 1 capsule (100 mg total) by mouth 2 (two) times daily.   ferrous sulfate 325 (65 FE) MG EC tablet Take 1 tablet (325 mg total) by mouth 2 (two) times daily.   ibuprofen 600 MG tablet Commonly known as: ADVIL Take 1 tablet (600 mg total) by mouth every 6 (six) hours as needed.   omeprazole 20 MG tablet Commonly known as: PRILOSEC OTC Take 20 mg by mouth daily.   oxyCODONE 5 MG immediate release tablet Commonly known as: Oxy IR/ROXICODONE Take 1 tablet (5 mg total) by mouth every 4 (four) hours as needed for severe pain.   PRENATAL PO Take 1 each by mouth daily. Prenatal gummy   ziprasidone 20 MG capsule Commonly known as: GEODON Take 20-80 mg by mouth See admin instructions. 20 mg up to twice daily + 20-80 mg at bedtime.         Discharge home in stable condition Infant Feeding: Bottle and Breast Infant Disposition:home with mother Discharge instruction: per After Visit Summary and Postpartum booklet. Activity: Advance as tolerated. Pelvic rest for 6 weeks.  Diet: routine diet Anticipated Birth Control: Unsure Postpartum Appointment:6 weeks Additional Postpartum F/U:  None Future Appointments:No future appointments. Follow up Visit:      01/23/2022 Tyson Dense, MD

## 2022-02-04 ENCOUNTER — Telehealth (HOSPITAL_COMMUNITY): Payer: Self-pay | Admitting: *Deleted

## 2022-02-04 NOTE — Telephone Encounter (Signed)
Hospital Discharge Follow-Up Call:  Patient reports that she is well and has no concerns about her healing process.  EPDS today was 5 and she endorses this accurately reflects that she is doing well emotionally.  Patient says that baby is well and she has no concerns about baby's health.  She says that her "milk never really came in" and that baby is exclusively formula feeding now.  She says that she is comfortable with baby's current feeding plan and declines lactation referral.  Patient reports that baby sleeps in a bedside bassinet.  Reviewed ABCs of Safe Sleep.

## 2022-09-25 NOTE — Progress Notes (Signed)
 Subjective   Katrina Maddox is a 32 y.o. female who presents for an annual exam. Katrina Maddox is a 32 year old female here in clinic to establish care as a new patient for primary care needs. States she has not been seen with her PCP in [place] for at least 2 years. Chronic history includes Crohn's disease previously managed by [facility], has appointment for them next month for recent flareups and follow-up.  History of bipolar disorder on Concerta for that and ADHD which she felt poorly on as well as Zoloft short trial.  Anxiety and ADHD recently more mismanaged after having her 13-month-old son Katrinka and getting back into work.  This is a focus of our concern today that she would like to address with medication management. She recently was on Trileptal for 4 years but felt over time it wore off and she is not interested in revisiting this medication.  She is currently taking her as needed hydroxyzine  25 mg tablets 3 times daily every day.  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 She currently works full time at [employer] in [place] where she is able to bring her son with her. She endorses increased stress with recently returning to work, her husband works also and she has an 78-month-old son that she takes to work with her so she feels like she never has a break for herself.  She lives on a grooved heartrate here in [place], drinks a large amount of caffeine per day to keep her going, a large cold brew in the morning and 3 red bowls throughout the day and may be more later on in the evening if she has a lot to do.  This is all to due to the fact that she has poor sleep and difficulty falling asleep and staying asleep with increased stressors and mind racing. She has noted that recently she is starting to bite her nails until they  bleed which is new for her. She previously saw psychiatry and neurology but would prefer to stay with 1 office for comanagement of her acute and chronic issues as she has limited time to go to appointments with her busy life and feels this would make her more stressed out having multiple care providers.    Health Maintenance: Last wellness visit:  more than 1 year ago Diet:  general Calcium supplementation:  never Vitamin D supplementation:  never Exercise frequency:  frequently Exercise type:  walking 1 mile daily Pap: was normal Mammogram:  n/a DEXA:  NA Colonoscopy:  Yes, polyp, Crohn's  Patient Active Problem List  Diagnosis  . Crohn's disease in remission (*)  . Macrosomia  . S/P cesarean section  . Sprain of right ankle   Outpatient Medications Marked as Taking for the 09/25/22 encounter (Office Visit) with Megan G Bradley, FNP  Medication Sig Dispense Refill  . ARIPiprazole (ABILIFY) 10 mg tablet TAKE 1 TABLET BY MOUTH EVERY DAY FOR 30 DAYS    . budesonide (UCERIS) 9 mg 24 hr tablet Take one tablet (9 mg dose) by mouth daily.    . hydrOXYzine  HCl (ATARAX ) 25 mg tablet Take 10 mg by mouth every 8 (eight) hours as needed for Anxiety.     Allergies Patient is allergic to marijuana (cannabis sativa), other, shellfish allergy, sulfa antibiotics, galactose,  gelatin, meclizine, and morphine .  Review of Systems - All other systems were reviewed and are negative unless stated in HPI.  Family History  Problem Relation Age of Onset  . No Known Problems Mother   . No Known Problems Father   . Neuropathy Maternal Uncle   . Neuropathy Maternal Grandmother   . Heart disease Maternal Grandfather    Past Medical History:  Diagnosis Date  . Inflammatory bowel disease    Past Surgical History:  Procedure Laterality Date  . Cesarean section     Pediatric History  Patient Parents  . Not on file   Other Topics Concern  . Not on file  Social History Narrative  . Not on file     has a past surgical history that includes Cesarean section.  Objective   BP 130/88 (BP Location: Left arm, Patient Position: Sitting)   Pulse 83   Temp 97.9 F (36.6 C) (Temporal)   Resp 18   Ht 5' 5 (1.651 m)   Wt 261 lb (118.4 kg)   LMP 09/22/2022   SpO2 96%   Breastfeeding No   BMI 43.43 kg/m   General: The patient is a 32 y.o. female who appears to be in no acute distress. Psych: She is alert and oriented to person, place, and time. Her mood and affect are normal. HEENT: Normocephalic, atraumatic, non-icteric sclera, PERRL.  Tympanic membranes are without perforation or infection.  Nasopharynx is grossly normal.  Oropharynx is without mass or exudate. Neck:  Supple.  Trachea is in midline position.  The neck is without adenopathy, masses, or thyromegaly. Lungs:  Good breath sounds are noted bilaterally.  The lungs are clear to auscultation and percussion bilaterally.  The spine and CVA region are  nontender to palpation. Cardio:  Regular rate and rhythm without gallop, rub, or murmur.  Abdomen:  Bowel sounds are physiologic.  The abdomen is soft and nontender to palpation.  No masses are noted.  No hepatosplenomegaly is noted.  Skin:  No rashes or worrisome lesions are noted.  Extremities:  The extremities are without cyanosis or significant contusions.  Pitting edema is not noted.  ROM is normal in all four extremities. Pulses: Adequate pulses are noted in all four extremities and both carotid arteries. Neuro:  Mental status is normal.  CN 2-11 are grossly normal.  Motor and sensory exams are grossly normal.  DTR are physiologic in all extremities.  Gait is stable. Breast Exam:  Deferred Pelvic:  Deferred   Impression     ICD-10-CM   1. Encounter for medical examination to establish care  Z00.00     2. Crohn's disease in remission (*)  K50.90 Comprehensive Metabolic Panel    CBC And Differential- [facility]    CBC And Differential- [facility]    Comprehensive Metabolic  Panel    3. Anxiety  F41.9 atomoxetine (STRATTERA) 10 mg capsule    Thyroid Panel With TSH    Thyroid Panel With TSH    4. Attention deficit hyperactivity disorder (ADHD), combined type  F90.2 atomoxetine (STRATTERA) 10 mg capsule    5. Encounter for lipid screening for cardiovascular disease  Z13.220 Lipid Panel With LDL/HDL Ratio   Z13.6 Lipid Panel With LDL/HDL Ratio    6. Weight gain  R63.5 Thyroid Panel With TSH    Thyroid Panel With TSH      Plan    Orders Placed This Encounter  Procedures  . Comprehensive Metabolic Panel  . CBC And Differential- [facility]  .  Lipid Panel With LDL/HDL Ratio  . Thyroid Panel With TSH   Will trial nonstimulant ADHD medication Strattera at a low dose once daily for her to see if this helps with ADHD and and anxiety combined symptom management.  - Health maintenance issues including appropriate cancer screening, healthy diet, exercise and tobacco avoidance were discussed with the patient.  I've encouraged healthy lifestyle modifications of eating fruits/vegetables, decreased fat intake, regular daily exercise, and decrease stress.  - Risks, benefits, and alternatives of the medications and treatment plan prescribed today were discussed, and patient expressed understanding.  - Pap smear and breast exam are done at her ob/gyn.  - Labs ordered today: cbc/d,cmp,tsh,vitamin d,lipid panel.  We will call pt with results. - Follow up in about 3 months (around 12/26/2022) for Follow up on new med - Medication, Blood Pressure, Labs. She will message in 2-3 weeks to follow up on how Strattero is doing for her. - Return to clinic to be reevaluated if symptoms worsen, persist, change, or if you have any other concerns. - I discussed this diagnosis with the patient and discussed the treatment plan with them. This treatment plan is also outlined in the Patient Instructions and a copy of this was provided to the patient.

## 2022-10-13 ENCOUNTER — Other Ambulatory Visit: Payer: Self-pay | Admitting: Gastroenterology

## 2022-10-13 DIAGNOSIS — K5000 Crohn's disease of small intestine without complications: Secondary | ICD-10-CM

## 2022-10-20 ENCOUNTER — Ambulatory Visit
Admission: RE | Admit: 2022-10-20 | Discharge: 2022-10-20 | Disposition: A | Discharge status: Home / Self Care | Payer: 59 | Source: Ambulatory Visit | Attending: Gastroenterology | Admitting: Gastroenterology

## 2022-10-20 DIAGNOSIS — K5000 Crohn's disease of small intestine without complications: Secondary | ICD-10-CM

## 2022-10-20 MED ORDER — IOPAMIDOL (ISOVUE-300) INJECTION 61%
100.0000 mL | Freq: Once | INTRAVENOUS | Status: AC | PRN
  Administered 2022-10-20: 100 mL via INTRAVENOUS

## 2023-09-17 ENCOUNTER — Other Ambulatory Visit: Payer: Self-pay

## 2023-09-17 ENCOUNTER — Emergency Department (HOSPITAL_BASED_OUTPATIENT_CLINIC_OR_DEPARTMENT_OTHER)
Admission: EM | Admit: 2023-09-17 | Discharge: 2023-09-17 | Disposition: A | Discharge status: Home / Self Care | Attending: Emergency Medicine | Admitting: Emergency Medicine

## 2023-09-17 DIAGNOSIS — K5090 Crohn's disease, unspecified, without complications: Secondary | ICD-10-CM | POA: Insufficient documentation

## 2023-09-17 DIAGNOSIS — R519 Headache, unspecified: Secondary | ICD-10-CM | POA: Insufficient documentation

## 2023-09-17 DIAGNOSIS — J45909 Unspecified asthma, uncomplicated: Secondary | ICD-10-CM | POA: Insufficient documentation

## 2023-09-17 LAB — BASIC METABOLIC PANEL WITH GFR
Anion gap: 13 (ref 5–15)
BUN: 15 mg/dL (ref 6–20)
CO2: 27 mmol/L (ref 22–32)
Calcium: 9.7 mg/dL (ref 8.9–10.3)
Chloride: 104 mmol/L (ref 98–111)
Creatinine, Ser: 0.76 mg/dL (ref 0.44–1.00)
GFR, Estimated: >60 mL/min (ref >60)
Glucose, Bld: 105 mg/dL — ABNORMAL HIGH (ref 70–99)
Potassium: 3.8 mmol/L (ref 3.5–5.1)
Sodium: 144 mmol/L (ref 135–145)

## 2023-09-17 LAB — CBC WITH DIFFERENTIAL/PLATELET
Abs Immature Granulocytes: 0.01 10*3/uL (ref 0.00–0.07)
Basophils Absolute: 0 10*3/uL (ref 0.0–0.1)
Basophils Relative: 1 %
Eosinophils Absolute: 0.1 10*3/uL (ref 0.0–0.5)
Eosinophils Relative: 2 %
HCT: 40.1 % (ref 36.0–46.0)
Hemoglobin: 13.9 g/dL (ref 12.0–15.0)
Immature Granulocytes: 0 %
Lymphocytes Relative: 35 %
Lymphs Abs: 2.6 10*3/uL (ref 0.7–4.0)
MCH: 30.3 pg (ref 26.0–34.0)
MCHC: 34.7 g/dL (ref 30.0–36.0)
MCV: 87.4 fL (ref 80.0–100.0)
Monocytes Absolute: 0.4 10*3/uL (ref 0.1–1.0)
Monocytes Relative: 5 %
Neutro Abs: 4.3 10*3/uL (ref 1.7–7.7)
Neutrophils Relative %: 57 %
Platelets: 272 10*3/uL (ref 150–400)
RBC: 4.59 MIL/uL (ref 3.87–5.11)
RDW: 11.8 % (ref 11.5–15.5)
WBC: 7.5 10*3/uL (ref 4.0–10.5)
nRBC: 0 % (ref 0.0–0.2)

## 2023-09-17 LAB — PREGNANCY, URINE: Preg Test, Ur: NEGATIVE

## 2023-09-17 LAB — CBG MONITORING, ED: Glucose-Capillary: 138 mg/dL — ABNORMAL HIGH (ref 70–99)

## 2023-09-17 MED ORDER — SODIUM CHLORIDE 0.9 % IV BOLUS
1000.0000 mL | Freq: Once | INTRAVENOUS | Status: AC
  Administered 2023-09-17: 1000 mL via INTRAVENOUS

## 2023-09-17 MED ORDER — KETOROLAC TROMETHAMINE 15 MG/ML IJ SOLN
15.0000 mg | Freq: Once | INTRAMUSCULAR | Status: AC
  Administered 2023-09-17: 15 mg via INTRAVENOUS
  Filled 2023-09-17: qty 1

## 2023-09-17 MED ORDER — ONDANSETRON HCL 4 MG PO TABS: 4.0000 mg | ORAL_TABLET | Freq: Four times a day (QID) | ORAL | 0 refills | Status: AC

## 2023-09-17 MED ORDER — DIPHENHYDRAMINE HCL 50 MG/ML IJ SOLN
12.5000 mg | Freq: Once | INTRAMUSCULAR | Status: AC
  Administered 2023-09-17: 12.5 mg via INTRAVENOUS
  Filled 2023-09-17: qty 1

## 2023-09-17 MED ORDER — PROCHLORPERAZINE EDISYLATE 10 MG/2ML IJ SOLN
5.0000 mg | Freq: Once | INTRAMUSCULAR | Status: AC
  Administered 2023-09-17: 5 mg via INTRAVENOUS
  Filled 2023-09-17: qty 2

## 2023-09-17 NOTE — ED Provider Notes (Signed)
 Accepted handoff at shift change from Jonne Netters, MD. Please see prior provider note for more detail.   Briefly: Patient is 33 y.o. presenting for headache with left-sided paresthesia.  DDX: concern for Migraine, cluster headache, Bell's palsy, CVA   Plan: HA cocktail and reassess BP and symptoms. Likely DC.   Physical Exam  BP (!) 143/87   Pulse 75   Temp 99.4 F (37.4 C) (Oral)   Resp (!) 21   SpO2 98%   Physical Exam  Procedures  Procedures  ED Course / MDM   Clinical Course as of 09/17/23 1543  Thu Sep 17, 2023  1518 Here for headache left sided paresthesia.  Headache cocktail and recheck blood pressure.  If remaining workup reassuring can be discharged with PCP follow-up. [JR]    Clinical Course User Index [JR] Janalee Mcmurray, PA-C   Medical Decision Making Amount and/or Complexity of Data Reviewed Labs: ordered.  Risk Prescription drug management.    On reassessment, headache had improved.  Blood pressure also normalizing.  Advised to follow-up with her PCP.  Discussed return precautions.  Discharged.  Sent Zofran  to her pharmacy at her request.       Katrina Maddox 09/17/23 1543    Hershel Los, MD 09/17/23 [phone]

## 2023-09-17 NOTE — ED Provider Notes (Signed)
 [place] AT [facility] Provider Note  CSN: [id number] Arrival date & time: 09/17/23 1355  Chief Complaint(s) Headache  HPI Katrina Maddox is a 33 y.o. female with PMHx cluster headaches, Crohn's disease, ADHD, MDD and GAD presents with headache.  Onset this morning during work, initially pain behind her left eye that then progressed to paresthesias along the left side of her face and head extending down to her left shoulder.  Reports she also had some floaters in her left eye yesterday but denies blurry vision today.  Denies any focal weakness or sensation abnormalities to upper or lower extremities.  Prior history of tension headaches, usually takes Excedrin which helps.  Took 2 Excedrin prior to presentation which helped her pain and now only has a modest headache but the paresthesias have continued.  Also concerned because her blood pressure has been elevated which is abnormal for her.  Past Medical History Past Medical History:  Diagnosis Date   Anxiety    Asthma    Bipolar 1 disorder (HCC)    Crohn's colitis (HCC)    Depression    Patient Active Problem List   Diagnosis Date Noted   S/P cesarean section 01/21/2022   Macrosomia 01/20/2022   Home Medication(s) Prior to Admission medications   Medication Sig Start Date End Date Taking? Authorizing Provider  amphetamine-dextroamphetamine (ADDERALL XR) 20 MG 24 hr capsule Take 20 mg by mouth in the morning. 09/02/23  Yes [provider]  docusate sodium  (COLACE) 100 MG capsule Take 1 capsule (100 mg total) by mouth 2 (two) times daily. 01/23/22   Concepcion Deck, MD  ferrous sulfate  325 (65 FE) MG EC tablet Take 1 tablet (325 mg total) by mouth 2 (two) times daily. 01/23/22   Concepcion Deck, MD  ibuprofen  (ADVIL ) 600 MG tablet Take 1 tablet (600 mg total) by mouth every 6 (six) hours as needed. 01/23/22   Concepcion Deck, MD  omeprazole (PRILOSEC OTC) 20 MG tablet Take 20  mg by mouth daily.    [provider]  oxyCODONE  (OXY IR/ROXICODONE ) 5 MG immediate release tablet Take 1 tablet (5 mg total) by mouth every 4 (four) hours as needed for severe pain. 01/23/22   Concepcion Deck, MD  Prenatal Vit-Fe Fumarate-FA (PRENATAL PO) Take 1 each by mouth daily. Prenatal gummy    [provider]  ziprasidone (GEODON) 20 MG capsule Take 20-80 mg by mouth See admin instructions. 20 mg up to twice daily + 20-80 mg at bedtime.    [provider]                                                                                                                                    Past Surgical History Past Surgical History:  Procedure Laterality Date   CESAREAN SECTION N/A 01/21/2022   Procedure: CESAREAN SECTION;  Surgeon: Dyanna Glasgow, DO;  Location: [facility] LD ORS;  Service: Obstetrics;  Laterality: N/A;   DILATION AND CURETTAGE OF UTERUS     WISDOM TOOTH EXTRACTION     Family History Family History  Problem Relation Age of Onset   Cancer Paternal Grandfather    Asthma Neg Hx    Diabetes Neg Hx    Heart disease Neg Hx    Hypertension Neg Hx    Kidney disease Neg Hx    Stroke Neg Hx     Social History Social History   Tobacco Use   Smoking status: Never   Smokeless tobacco: Never  Vaping Use   Vaping status: Never Used  Substance Use Topics   Alcohol use: Not Currently    Comment: not while preg   Drug use: Never   Allergies Gelatin, Ms contin  [morphine ], and Other  Review of Systems A thorough review of systems was obtained and all systems are negative except as noted in the HPI and PMH.   Physical Exam Vital Signs  I have reviewed the triage vital signs BP (!) 160/109 (BP Location: Right Arm)   Pulse 73   Temp 99.4 F (37.4 C) (Oral)   Resp 18   SpO2 99%  Physical Exam General: Well-appearing, alert, NAD Eyes: PERRLA, anicteric sclera ENTM: Moist mucus membranes. Neck: Supple, non-tender Cardiovascular: RRR without  murmur Respiratory: CTAB. Normal WOB on RA Gastrointestinal: Soft, non-tender, non-distended MSK: No peripheral edema Derm: Warm, dry, no rashes noted Neuro: CN II: PERRL CN III, IV,VI: EOMI CV V: Normal sensation in V1, V2, V3 CVII: Symmetric smile and brow raise CN VIII: Normal hearing CN IX,X: Symmetric palate raise  CN XI: 5/5 shoulder shrug CN XII: Symmetric tongue protrusion  UE and LE strength 5/5 Normal sensation in UE and LE bilaterally  No ataxia with finger to nose Psych: Cooperative, pleasant    ED Results and Treatments Labs (all labs ordered are listed, but only abnormal results are displayed) Labs Reviewed  CBG MONITORING, ED - Abnormal; Notable for the following components:      Result Value   Glucose-Capillary 138 (*)    All other components within normal limits  PREGNANCY, URINE  CBC WITH DIFFERENTIAL/PLATELET  BASIC METABOLIC PANEL WITH GFR                                                                                                                          Radiology No results found.  Pertinent labs & imaging results that were available during my care of the patient were reviewed by me and considered in my medical decision making (see MDM for details).  Medications Ordered in ED Medications  ketorolac  (TORADOL ) 15 MG/ML injection 15 mg (has no administration in time range)  prochlorperazine  (COMPAZINE ) injection 5 mg (has no administration in time range)  diphenhydrAMINE  (BENADRYL ) injection 12.5 mg (has no administration in time range)  sodium chloride  0.9 % bolus 1,000 mL (1,000 mLs Intravenous New Bag/Given 09/17/23 1448)  Medical Decision Making / ED Course    Medical Decision Making:    Katrina Maddox is a 33 y.o. female PMHx cluster headaches, Crohn's disease, ADHD, MDD and GAD presents with headache. The complaint involves an extensive differential diagnosis and also  carries with it a high risk of complications and morbidity.  Serious etiology was considered. Ddx includes but is not limited to: Migraine, cluster headache, Bell's palsy, CVA  Complete initial physical exam performed, notably the patient was in no distress.    Reviewed and confirmed nursing documentation for past medical history, family history, social history.  Vital signs reviewed.     Brief summary:  33 year old female with history as above presenting with headache with left facial paresthesia.  Exam most consistent with migraine, will treat with headache cocktail and reassess for symptomatic improvement.  Elevated BP likely in the setting of pain.  Patient signed out to ED provider.   Clinical Course as of 09/17/23 1518  Thu Sep 17, 2023  1518 Here for headache.  Headache cocktail and recheck blood pressure.  If remaining workup reassuring can be discharged with PCP follow-up. [JR]    Clinical Course User Index [JR] Janalee Mcmurray, PA-C      Additional history obtained: -External records from outside source obtained and reviewed including: Chart review including previous notes, labs, imaging, consultation notes including: OP PCP notes   Lab Tests: -I ordered, reviewed, and interpreted labs.   The pertinent results include:   Labs Reviewed  CBG MONITORING, ED - Abnormal; Notable for the following components:      Result Value   Glucose-Capillary 138 (*)    All other components within normal limits  PREGNANCY, URINE  CBC WITH DIFFERENTIAL/PLATELET  BASIC METABOLIC PANEL WITH GFR    EKG   EKG Interpretation Date/Time:    Ventricular Rate:    PR Interval:    QRS Duration:    QT Interval:    QTC Calculation:   R Axis:      Text Interpretation:          Medicines ordered and prescription drug management: Meds ordered this encounter  Medications   ketorolac  (TORADOL ) 15 MG/ML injection 15 mg   prochlorperazine  (COMPAZINE ) injection 5 mg   diphenhydrAMINE   (BENADRYL ) injection 12.5 mg   sodium chloride  0.9 % bolus 1,000 mL    -I have reviewed the patients home medicines and have made adjustments as needed  Cardiac Monitoring: The patient was maintained on a cardiac monitor.  I personally viewed and interpreted the cardiac monitored which showed an underlying rhythm of: NSR Continuous pulse oximetry interpreted by myself, 99% on RA.   Co morbidities that complicate the patient evaluation  Past Medical History:  Diagnosis Date   Anxiety    Asthma    Bipolar 1 disorder (HCC)    Crohn's colitis (HCC)    Depression       Dispostion: Disposition decision including need for hospitalization was considered, and patient disposition pending at time of sign out.    Final Clinical Impression(s) / ED Diagnoses Final diagnoses:  None        Jonne Netters, MD 09/17/23 1519    Sueellen Emery, MD 09/18/23 [phone]

## 2023-09-17 NOTE — Discharge Instructions (Signed)
 Evaluation today was reassuring.  You likely had a migraine.  Please follow-up with your PCP.  I also sent Zofran  to your pharmacy.  As we discussed recommend a migraine cocktail at home which includes Tylenol , ibuprofen , caffeine and Zofran .  If you have another worsening headache, visual disturbance, neck stiffness, fever, weakness or numbness in your extremities or any other concerning symptom please return to the ED for further evaluation.

## 2023-09-17 NOTE — ED Triage Notes (Signed)
 States started having headache behind L eye w/ numbness and tingling in L side of face. LKN 2100 on  6/10. Hypertensive.   No hx of migraines or HTN.

## 2023-10-23 ENCOUNTER — Ambulatory Visit
Admission: EM | Admit: 2023-10-23 | Discharge: 2023-10-23 | Disposition: A | Discharge status: Home / Self Care | Attending: Family Medicine | Admitting: Family Medicine

## 2023-10-23 ENCOUNTER — Encounter (HOSPITAL_BASED_OUTPATIENT_CLINIC_OR_DEPARTMENT_OTHER): Payer: Self-pay

## 2023-10-23 ENCOUNTER — Emergency Department (HOSPITAL_BASED_OUTPATIENT_CLINIC_OR_DEPARTMENT_OTHER)
Admission: EM | Admit: 2023-10-23 | Discharge: 2023-10-23 | Disposition: A | Discharge status: Home / Self Care | Attending: Emergency Medicine | Admitting: Emergency Medicine

## 2023-10-23 ENCOUNTER — Other Ambulatory Visit: Payer: Self-pay

## 2023-10-23 DIAGNOSIS — R000 Tachycardia, unspecified: Secondary | ICD-10-CM

## 2023-10-23 DIAGNOSIS — J069 Acute upper respiratory infection, unspecified: Secondary | ICD-10-CM | POA: Insufficient documentation

## 2023-10-23 DIAGNOSIS — R9431 Abnormal electrocardiogram [ECG] [EKG]: Secondary | ICD-10-CM | POA: Diagnosis not present

## 2023-10-23 DIAGNOSIS — J988 Other specified respiratory disorders: Secondary | ICD-10-CM

## 2023-10-23 DIAGNOSIS — J029 Acute pharyngitis, unspecified: Secondary | ICD-10-CM | POA: Diagnosis present

## 2023-10-23 DIAGNOSIS — D72829 Elevated white blood cell count, unspecified: Secondary | ICD-10-CM | POA: Diagnosis not present

## 2023-10-23 DIAGNOSIS — J040 Acute laryngitis: Secondary | ICD-10-CM | POA: Diagnosis not present

## 2023-10-23 DIAGNOSIS — B9789 Other viral agents as the cause of diseases classified elsewhere: Secondary | ICD-10-CM | POA: Diagnosis not present

## 2023-10-23 LAB — COMPREHENSIVE METABOLIC PANEL WITH GFR
ALT: 27 U/L (ref 0–44)
AST: 21 U/L (ref 15–41)
Albumin: 4.7 g/dL (ref 3.5–5.0)
Alkaline Phosphatase: 77 U/L (ref 38–126)
Anion gap: 14 (ref 5–15)
BUN: 11 mg/dL (ref 6–20)
CO2: 24 mmol/L (ref 22–32)
Calcium: 9.6 mg/dL (ref 8.9–10.3)
Chloride: 103 mmol/L (ref 98–111)
Creatinine, Ser: 0.73 mg/dL (ref 0.44–1.00)
GFR, Estimated: >60 mL/min (ref >60)
Glucose, Bld: 95 mg/dL (ref 70–99)
Potassium: 3.7 mmol/L (ref 3.5–5.1)
Sodium: 141 mmol/L (ref 135–145)
Total Bilirubin: 0.5 mg/dL (ref 0.0–1.2)
Total Protein: 8.1 g/dL (ref 6.5–8.1)

## 2023-10-23 LAB — CBC
HCT: 38.8 % (ref 36.0–46.0)
Hemoglobin: 13.6 g/dL (ref 12.0–15.0)
MCH: 30.4 pg (ref 26.0–34.0)
MCHC: 35.1 g/dL (ref 30.0–36.0)
MCV: 86.8 fL (ref 80.0–100.0)
Platelets: 259 K/uL (ref 150–400)
RBC: 4.47 MIL/uL (ref 3.87–5.11)
RDW: 11.9 % (ref 11.5–15.5)
WBC: 17 K/uL — ABNORMAL HIGH (ref 4.0–10.5)
nRBC: 0 % (ref 0.0–0.2)

## 2023-10-23 LAB — POCT RAPID STREP A (OFFICE): Rapid Strep A Screen: NEGATIVE

## 2023-10-23 MED ORDER — ACETAMINOPHEN 500 MG PO TABS
1000.0000 mg | ORAL_TABLET | Freq: Once | ORAL | Status: AC
  Administered 2023-10-23: 1000 mg via ORAL
  Filled 2023-10-23: qty 2

## 2023-10-23 MED ORDER — CETIRIZINE HCL 10 MG PO TABS: 10.0000 mg | ORAL_TABLET | Freq: Every day | ORAL | 0 refills | Status: AC

## 2023-10-23 MED ORDER — PREDNISONE 10 MG PO TABS: 30.0000 mg | ORAL_TABLET | Freq: Every day | ORAL | 0 refills | Status: DC

## 2023-10-23 MED ORDER — DEXAMETHASONE SODIUM PHOSPHATE 10 MG/ML IJ SOLN: 10.0000 mg | Freq: Once | INTRAMUSCULAR | Status: DC

## 2023-10-23 MED ORDER — LIDOCAINE VISCOUS HCL 2 % MT SOLN
15.0000 mL | Freq: Once | OROMUCOSAL | Status: AC
  Administered 2023-10-23: 15 mL via OROMUCOSAL
  Filled 2023-10-23: qty 15

## 2023-10-23 MED ORDER — PREDNISONE 20 MG PO TABS: 50.0000 mg | ORAL_TABLET | Freq: Every day | ORAL | 0 refills | Status: AC

## 2023-10-23 MED ORDER — SODIUM CHLORIDE 0.9 % IV BOLUS
1000.0000 mL | Freq: Once | INTRAVENOUS | Status: AC
  Administered 2023-10-23: 1000 mL via INTRAVENOUS

## 2023-10-23 NOTE — ED Provider Notes (Signed)
 [facility]  Note:  This document was prepared using Conservation officer, historic buildings and may include unintentional dictation errors.  MRN: [id number] DOB: 10/21/1990  Subjective:   Katrina Maddox is a 33 y.o. female presenting for 1 day history of acute onset throat pain, sinus drainage, bilateral ear fullness and pain, hoarseness. Has had significant sinus drainage causing nausea, upset stomach and feels it is aggravating her Crohn's disease. Has had seem mucus in her stool. Patient is currently getting titrated with her Adderall.   No current facility-administered medications for this encounter.  Current Outpatient Medications:    amphetamine-dextroamphetamine (ADDERALL XR) 20 MG 24 hr capsule, Take 20 mg by mouth in the morning., Disp: , Rfl:    docusate sodium  (COLACE) 100 MG capsule, Take 1 capsule (100 mg total) by mouth 2 (two) times daily., Disp: 60 capsule, Rfl: 2   ferrous sulfate  325 (65 FE) MG EC tablet, Take 1 tablet (325 mg total) by mouth 2 (two) times daily., Disp: 60 tablet, Rfl: 2   ibuprofen  (ADVIL ) 600 MG tablet, Take 1 tablet (600 mg total) by mouth every 6 (six) hours as needed., Disp: 30 tablet, Rfl: 0   omeprazole (PRILOSEC OTC) 20 MG tablet, Take 20 mg by mouth daily., Disp: , Rfl:    ondansetron  (ZOFRAN ) 4 MG tablet, Take 1 tablet (4 mg total) by mouth every 6 (six) hours., Disp: 12 tablet, Rfl: 0   oxyCODONE  (OXY IR/ROXICODONE ) 5 MG immediate release tablet, Take 1 tablet (5 mg total) by mouth every 4 (four) hours as needed for severe pain., Disp: 15 tablet, Rfl: 0   Prenatal Vit-Fe Fumarate-FA (PRENATAL PO), Take 1 each by mouth daily. Prenatal gummy, Disp: , Rfl:    ziprasidone (GEODON) 20 MG capsule, Take 20-80 mg by mouth See admin instructions. 20 mg up to twice daily + 20-80 mg at bedtime., Disp: , Rfl:    Allergies  Allergen Reactions   Gelatin Hives and Nausea Only    Allergy resolved - Alpha gal cleared up   Ms Contin   [Morphine ] Nausea Only   Other     Red meat Allergy resolved - Alpha gal cleared up    Past Medical History:  Diagnosis Date   Anxiety    Asthma    Bipolar 1 disorder (HCC)    Crohn's colitis (HCC)    Depression      Past Surgical History:  Procedure Laterality Date   CESAREAN SECTION N/A 01/21/2022   Procedure: CESAREAN SECTION;  Surgeon: Dannielle Bouchard, DO;  Location: [facility] LD ORS;  Service: Obstetrics;  Laterality: N/A;   DILATION AND CURETTAGE OF UTERUS     WISDOM TOOTH EXTRACTION      Family History  Problem Relation Age of Onset   Cancer Paternal Grandfather    Asthma Neg Hx    Diabetes Neg Hx    Heart disease Neg Hx    Hypertension Neg Hx    Kidney disease Neg Hx    Stroke Neg Hx     Social History   Tobacco Use   Smoking status: Never   Smokeless tobacco: Never  Vaping Use   Vaping status: Never Used  Substance Use Topics   Alcohol use: Not Currently    Comment: not while preg   Drug use: Never    ROS   Objective:   Vitals: BP (!) 173/108 (BP Location: Right Arm)   Pulse (!) 116   Temp 99.2 F (37.3 C) (Oral)   Resp  16   LMP 10/05/2023 (Approximate)   SpO2 96%   Breastfeeding No   Physical Exam Constitutional:      General: She is not in acute distress.    Appearance: Normal appearance. She is well-developed and normal weight. She is not ill-appearing, toxic-appearing or diaphoretic.  HENT:     Head: Normocephalic and atraumatic.     Right Ear: Ear canal and external ear normal. No drainage, swelling or tenderness. A middle ear effusion is present. There is no impacted cerumen. Tympanic membrane is not erythematous or bulging.     Left Ear: Ear canal and external ear normal. No drainage, swelling or tenderness. A middle ear effusion is present. There is no impacted cerumen. Tympanic membrane is not erythematous or bulging.     Nose: Congestion present. No rhinorrhea.     Mouth/Throat:     Mouth: Mucous membranes are moist. No oral lesions.      Pharynx: No pharyngeal swelling, oropharyngeal exudate, posterior oropharyngeal erythema or uvula swelling.     Tonsils: No tonsillar exudate or tonsillar abscesses.     Comments: Hoarseness noted. Thick streaks of postnasal drainage overlying pharynx.  Eyes:     General: No scleral icterus.       Right eye: No discharge.        Left eye: No discharge.     Extraocular Movements: Extraocular movements intact.     Right eye: Normal extraocular motion.     Left eye: Normal extraocular motion.     Conjunctiva/sclera: Conjunctivae normal.  Cardiovascular:     Rate and Rhythm: Normal rate.  Pulmonary:     Effort: Pulmonary effort is normal.  Musculoskeletal:     Cervical back: Normal range of motion and neck supple.  Lymphadenopathy:     Cervical: No cervical adenopathy.  Skin:    General: Skin is warm and dry.  Neurological:     General: No focal deficit present.     Mental Status: She is alert and oriented to person, place, and time.  Psychiatric:        Mood and Affect: Mood normal.        Behavior: Behavior normal.     Results for orders placed or performed during the hospital encounter of 10/23/23 (from the past 24 hours)  POCT rapid strep A     Status: Normal   Collection Time: 10/23/23  6:22 PM  Result Value Ref Range   Rapid Strep A Screen Negative Negative   ED ECG REPORT   Date: 10/24/2023  EKG Time: 8:07 AM  Rate: 102bpm  Rhythm: sinus tachycardia,  there are no previous tracings available for comparison  Axis: normal  Intervals:none  ST&T Change: T-wave inversion in III, aVF  Narrative Interpretation: Sinus tachycardia at 102 bpm with T wave change as above.  No acute findings, no previous ECG for comparison.   Assessment and Plan :   PDMP not reviewed this encounter.  1. Viral respiratory infection   2. Laryngitis   3. Tachycardia, unspecified   4. Nonspecific abnormal electrocardiogram (ECG) (EKG)    EKG from above likely representing a mild demand  ischemia secondary to her tachycardia, dehydration, fever, viral URI.  This could also be exacerbated by her use of Adderall.  Recommended managing with supportive care.  Rapid COVID flu test negative.  Patient does have concerns about a Crohn's flare and was interested in a prednisone  course.  I do believe that this could help her significant respiratory symptoms, laryngitis.  However, she has tachycardia that I advised monitoring for.  She is to push fluids tonight and recheck her tachycardia tomorrow.  Advised that if she continues to have significant and sustained tachycardia that she present to the emergency room and avoid using prednisone .  Counseled patient on potential for adverse effects with medications prescribed/recommended today, ER and return-to-clinic precautions discussed, patient verbalized understanding.    Christopher Savannah, [place] 10/24/23 [phone]

## 2023-10-23 NOTE — ED Triage Notes (Signed)
 Pt states sore throat and bilateral ear pain since last night.

## 2023-10-23 NOTE — ED Provider Notes (Signed)
 [facility] Provider Note   CSN: [id number] Arrival date & time: 10/23/23  2033     Patient presents with: Tachycardia   Katrina Maddox is a 33 y.o. female.  With past medical history of bipolar 1, chron's disease presents to emergency room with complaint of flulike symptoms.  Patient has nasal drainage, sore throat, bilateral ear pain, muscle aches for approximately 1 day.  She reports she was seen at urgent care for this and had high heart rate thought to be secondary to low-grade fever versus dehydration.  She was encouraged to come to emergency room for further workup if tachycardia did not improve after oral rehydration however patient wanted to be checked out immediately.  She denies chest pain shortness of breath cough abdominal pain vomiting or diarrhea.    HPI     Prior to Admission medications   Medication Sig Start Date End Date Taking? Authorizing Provider  amphetamine-dextroamphetamine (ADDERALL XR) 20 MG 24 hr capsule Take 20 mg by mouth in the morning. 09/02/23   [provider]  cetirizine (ZYRTEC ALLERGY) 10 MG tablet Take 1 tablet (10 mg total) by mouth daily. 10/23/23   Christopher Savannah, PA-C  docusate sodium  (COLACE) 100 MG capsule Take 1 capsule (100 mg total) by mouth 2 (two) times daily. 01/23/22   Marne Kelly Nest, MD  ferrous sulfate  325 (65 FE) MG EC tablet Take 1 tablet (325 mg total) by mouth 2 (two) times daily. 01/23/22   Marne Kelly Nest, MD  ibuprofen  (ADVIL ) 600 MG tablet Take 1 tablet (600 mg total) by mouth every 6 (six) hours as needed. 01/23/22   Marne Kelly Nest, MD  omeprazole (PRILOSEC OTC) 20 MG tablet Take 20 mg by mouth daily.    [provider]  ondansetron  (ZOFRAN ) 4 MG tablet Take 1 tablet (4 mg total) by mouth every 6 (six) hours. 09/17/23   Robinson, John K, PA-C  oxyCODONE  (OXY IR/ROXICODONE ) 5 MG immediate release tablet Take 1 tablet (5 mg total) by mouth every 4 (four)  hours as needed for severe pain. 01/23/22   Marne Kelly Nest, MD  predniSONE  (DELTASONE ) 10 MG tablet Take 3 tablets (30 mg total) by mouth daily with breakfast. 10/23/23   Christopher Savannah, PA-C  Prenatal Vit-Fe Fumarate-FA (PRENATAL PO) Take 1 each by mouth daily. Prenatal gummy    [provider]  ziprasidone (GEODON) 20 MG capsule Take 20-80 mg by mouth See admin instructions. 20 mg up to twice daily + 20-80 mg at bedtime.    [provider]    Allergies: Gelatin, Ms contin  [morphine ], and Other    Review of Systems  HENT:  Positive for congestion.     Updated Vital Signs BP (!) 155/98   Pulse (!) 104   Temp 98.2 F (36.8 C)   Resp (!) 23   LMP 10/05/2023 (Approximate)   SpO2 98%   Physical Exam Vitals and nursing note reviewed.  Constitutional:      General: She is not in acute distress.    Appearance: She is not toxic-appearing.  HENT:     Head: Normocephalic and atraumatic.     Ears:     Comments: Some mild erythema of bilateral tympanic membrane.  No bulging TM. Normal ext ear canal.     Mouth/Throat:     Pharynx: Pharyngeal swelling present.     Comments: Uvula is midline.  No significant swelling.  Does have some scratchy throat.  She is otherwise having  normal phonation and handling secretions. Eyes:     General: No scleral icterus.    Conjunctiva/sclera: Conjunctivae normal.  Cardiovascular:     Rate and Rhythm: Regular rhythm. Tachycardia present.     Pulses: Normal pulses.     Heart sounds: Normal heart sounds.  Pulmonary:     Effort: Pulmonary effort is normal. No respiratory distress.     Breath sounds: Normal breath sounds.     Comments: No acute distress, lungs clear to auscultation bilaterally. Abdominal:     General: Abdomen is flat. Bowel sounds are normal.     Palpations: Abdomen is soft.     Tenderness: There is no abdominal tenderness.     Comments: No focal area of abdominal tenderness on exam.  Musculoskeletal:     Right  lower leg: No edema.     Left lower leg: No edema.  Skin:    General: Skin is warm and dry.     Findings: No lesion.  Neurological:     General: No focal deficit present.     Mental Status: She is alert and oriented to person, place, and time. Mental status is at baseline.     (all labs ordered are listed, but only abnormal results are displayed) Labs Reviewed  CBC - Abnormal; Notable for the following components:      Result Value   WBC 17.0 (*)    All other components within normal limits  COMPREHENSIVE METABOLIC PANEL WITH GFR    EKG: None  Radiology: No results found.   Procedures   Medications Ordered in the ED  acetaminophen  (TYLENOL ) tablet 1,000 mg (has no administration in time range)  lidocaine  (XYLOCAINE ) 2 % viscous mouth solution 15 mL (has no administration in time range)  sodium chloride  0.9 % bolus 1,000 mL (1,000 mLs Intravenous New Bag/Given 10/23/23 2133)                                    Medical Decision Making Amount and/or Complexity of Data Reviewed Labs: ordered.  Risk OTC drugs. Prescription drug management.   Katrina Maddox 33 y.o. presented today for URI like symptoms. Working DDx that I considered at this time includes, but not limited to, viral illness, pharyngitis, mono, sinusitis, electrolyte abnormality, AOM.  R/o DDx: these additional diagnoses are not consistent with patient's history, presentation, physical exam, labs/imaging findings.   Labs:  Patient had respiratory panel done at urgent care that was negative today.  Also had strep test today that was negative. Today CBC with leukocytosis likely reactive.  CMP without significant electrolyte abnormality.  Problem List / ED Course / Critical interventions / Medication management  Patient presents with symptoms consistent with viral URI.  She has earache, congestion, runny nose.  She does have sinus tachycardia which is likely secondary to some mild dehydration as she  reports she has had poor oral intake throughout today with being in urgent care and feeling ill.  She is not having cough.  Lungs are clear to auscultation bilaterally.  No chest pain or shortness of breath.  On my exam she has no focal area of abdominal tenderness.  She does have history of Crohn's and has noticed some mucus in stool but she does not feel that she is having Crohn's disease exacerbation as she has not noticed any abdominal pain or significant symptoms thus do not feel imaging of abdomen is needed today.  Symptoms  do not seem consistent with pulmonary embolism.  Feel viral illness is most likely.  Symptom management and give strict return precautions.  She is established with GI doctor and will call to schedule appointment for follow-up.  She is tolerating oral intake. I ordered medication including viscous lidocaine , normal saline Reevaluation of the patient after these medicines showed that the patient improved Patients vitals assessed. Upon arrival patient is  hemodynamically stable.  I have reviewed the patients home medicines and have made adjustments as needed     Plan: F/u w/ PCP in 2-3d to ensure resolution of sx.  Patient was given return precautions. Patient stable for discharge at this time.  Patient educated on sx and dx and verbalized understanding of plan. Return to ER if new or worsening sx.        Final diagnoses:  Viral URI    ED Discharge Orders     None          Shermon Warren SAILOR, PA-C 10/23/23 2247    Ruthe Cornet, DO 10/23/23 2252

## 2023-10-23 NOTE — ED Triage Notes (Signed)
 Pt c/o mucus, congestion, first mucus in my stool- hx crohns. Last night, kept swallowing it. Advises she had covid/ strep test- negative today. Was at [facility] for same, sent for abnormal EKG- my HR was really fast. States that UC told her to hydrate, but my mom said people die from heart attacks all the time so I came in.

## 2023-10-23 NOTE — Discharge Instructions (Signed)
 Make sure you are staying well-hydrated with water  and Gatorade or electrolyte drink.  You can try bland foods like banana toast and rice. Your symptoms are consistent with viral infection. Please keep an eye on your Crohn's disease symptoms and if they seem to be worsening even with trial of prednisone  follow-up with GI for further recommendations or return to ER.

## 2023-10-24 LAB — POC COVID19/FLU A&B COMBO
Covid Antigen, POC: NEGATIVE
Influenza A Antigen, POC: NEGATIVE
Influenza B Antigen, POC: NEGATIVE
